# Patient Record
Sex: Male | Born: 1955 | Race: White | Hispanic: No | Smoking: Current every day smoker
Health system: Southern US, Community
[De-identification: ages and names within clinical notes are randomized; demographics above are authoritative.]

## PROBLEM LIST (undated history)

## (undated) DIAGNOSIS — E7800 Pure hypercholesterolemia, unspecified: Secondary | ICD-10-CM

## (undated) DIAGNOSIS — Z9989 Dependence on other enabling machines and devices: Secondary | ICD-10-CM

## (undated) DIAGNOSIS — I10 Essential (primary) hypertension: Secondary | ICD-10-CM

## (undated) DIAGNOSIS — G4733 Obstructive sleep apnea (adult) (pediatric): Secondary | ICD-10-CM

## (undated) HISTORY — DX: Pure hypercholesterolemia, unspecified: E78.00

## (undated) HISTORY — PX: BACK SURGERY: SHX140

## (undated) HISTORY — PX: LAPAROSCOPIC CHOLECYSTECTOMY: SUR755

## (undated) HISTORY — PX: TONSILLECTOMY: SUR1361

---

## 1998-12-17 ENCOUNTER — Ambulatory Visit: Admission: RE | Admit: 1998-12-17 | Discharge: 1998-12-17 | Payer: Self-pay | Admitting: Pulmonary Disease

## 2000-06-16 HISTORY — PX: POSTERIOR LUMBAR FUSION: SHX6036

## 2001-05-04 ENCOUNTER — Encounter: Admission: RE | Admit: 2001-05-04 | Discharge: 2001-05-04 | Payer: Self-pay | Admitting: Neurosurgery

## 2001-05-04 ENCOUNTER — Encounter: Payer: Self-pay | Admitting: Neurosurgery

## 2001-05-24 ENCOUNTER — Encounter: Payer: Self-pay | Admitting: Neurosurgery

## 2001-05-26 ENCOUNTER — Encounter: Payer: Self-pay | Admitting: Neurosurgery

## 2001-05-26 ENCOUNTER — Inpatient Hospital Stay (HOSPITAL_COMMUNITY): Admission: RE | Admit: 2001-05-26 | Discharge: 2001-05-28 | Payer: Self-pay | Admitting: Neurosurgery

## 2002-04-15 ENCOUNTER — Ambulatory Visit (HOSPITAL_COMMUNITY): Admission: RE | Admit: 2002-04-15 | Discharge: 2002-04-15 | Payer: Self-pay | Admitting: Internal Medicine

## 2002-07-13 ENCOUNTER — Encounter: Payer: Self-pay | Admitting: Internal Medicine

## 2002-07-13 ENCOUNTER — Ambulatory Visit (HOSPITAL_COMMUNITY): Admission: RE | Admit: 2002-07-13 | Discharge: 2002-07-13 | Payer: Self-pay | Admitting: Internal Medicine

## 2002-08-04 ENCOUNTER — Encounter: Payer: Self-pay | Admitting: Surgery

## 2002-08-10 ENCOUNTER — Observation Stay (HOSPITAL_COMMUNITY): Admission: RE | Admit: 2002-08-10 | Discharge: 2002-08-11 | Payer: Self-pay | Admitting: Surgery

## 2002-08-10 ENCOUNTER — Encounter: Payer: Self-pay | Admitting: Surgery

## 2002-10-12 ENCOUNTER — Emergency Department (HOSPITAL_COMMUNITY): Admission: EM | Admit: 2002-10-12 | Discharge: 2002-10-12 | Payer: Self-pay | Admitting: Emergency Medicine

## 2002-10-12 ENCOUNTER — Encounter: Payer: Self-pay | Admitting: *Deleted

## 2006-05-12 ENCOUNTER — Emergency Department (HOSPITAL_COMMUNITY): Admission: EM | Admit: 2006-05-12 | Discharge: 2006-05-12 | Payer: Self-pay | Admitting: Emergency Medicine

## 2013-12-16 ENCOUNTER — Inpatient Hospital Stay (HOSPITAL_COMMUNITY): Payer: No Typology Code available for payment source

## 2013-12-16 ENCOUNTER — Inpatient Hospital Stay (HOSPITAL_COMMUNITY)
Admission: EM | Admit: 2013-12-16 | Discharge: 2013-12-18 | DRG: 312 | Disposition: A | Discharge status: Home / Self Care | Payer: No Typology Code available for payment source | Attending: Family Medicine | Admitting: Family Medicine

## 2013-12-16 ENCOUNTER — Encounter (HOSPITAL_COMMUNITY): Payer: Self-pay | Admitting: Emergency Medicine

## 2013-12-16 ENCOUNTER — Emergency Department (HOSPITAL_COMMUNITY): Payer: No Typology Code available for payment source

## 2013-12-16 DIAGNOSIS — Z79899 Other long term (current) drug therapy: Secondary | ICD-10-CM

## 2013-12-16 DIAGNOSIS — R55 Syncope and collapse: Principal | ICD-10-CM | POA: Diagnosis present

## 2013-12-16 DIAGNOSIS — I10 Essential (primary) hypertension: Secondary | ICD-10-CM | POA: Diagnosis present

## 2013-12-16 DIAGNOSIS — R32 Unspecified urinary incontinence: Secondary | ICD-10-CM | POA: Diagnosis present

## 2013-12-16 DIAGNOSIS — Z9089 Acquired absence of other organs: Secondary | ICD-10-CM

## 2013-12-16 DIAGNOSIS — G4733 Obstructive sleep apnea (adult) (pediatric): Secondary | ICD-10-CM | POA: Diagnosis present

## 2013-12-16 DIAGNOSIS — I498 Other specified cardiac arrhythmias: Secondary | ICD-10-CM | POA: Diagnosis present

## 2013-12-16 DIAGNOSIS — L255 Unspecified contact dermatitis due to plants, except food: Secondary | ICD-10-CM | POA: Diagnosis present

## 2013-12-16 DIAGNOSIS — Z981 Arthrodesis status: Secondary | ICD-10-CM

## 2013-12-16 DIAGNOSIS — R001 Bradycardia, unspecified: Secondary | ICD-10-CM

## 2013-12-16 DIAGNOSIS — F17200 Nicotine dependence, unspecified, uncomplicated: Secondary | ICD-10-CM | POA: Diagnosis present

## 2013-12-16 HISTORY — DX: Obstructive sleep apnea (adult) (pediatric): G47.33

## 2013-12-16 HISTORY — DX: Dependence on other enabling machines and devices: Z99.89

## 2013-12-16 HISTORY — DX: Essential (primary) hypertension: I10

## 2013-12-16 LAB — RAPID URINE DRUG SCREEN, HOSP PERFORMED
AMPHETAMINES: NOT DETECTED
BENZODIAZEPINES: NOT DETECTED
Barbiturates: NOT DETECTED
COCAINE: NOT DETECTED
Opiates: NOT DETECTED
TETRAHYDROCANNABINOL: NOT DETECTED

## 2013-12-16 LAB — CBC
HCT: 44.3 % (ref 39.0–52.0)
HEMOGLOBIN: 15.3 g/dL (ref 13.0–17.0)
MCH: 31.9 pg (ref 26.0–34.0)
MCHC: 34.5 g/dL (ref 30.0–36.0)
MCV: 92.3 fL (ref 78.0–100.0)
Platelets: 208 10*3/uL (ref 150–400)
RBC: 4.8 MIL/uL (ref 4.22–5.81)
RDW: 12.4 % (ref 11.5–15.5)
WBC: 9.3 10*3/uL (ref 4.0–10.5)

## 2013-12-16 LAB — BASIC METABOLIC PANEL
Anion gap: 12 (ref 5–15)
BUN: 12 mg/dL (ref 6–23)
CO2: 25 mEq/L (ref 19–32)
Calcium: 9.4 mg/dL (ref 8.4–10.5)
Chloride: 102 mEq/L (ref 96–112)
Creatinine, Ser: 0.94 mg/dL (ref 0.50–1.35)
Glucose, Bld: 125 mg/dL — ABNORMAL HIGH (ref 70–99)
Potassium: 4.2 mEq/L (ref 3.7–5.3)
SODIUM: 139 meq/L (ref 137–147)

## 2013-12-16 LAB — I-STAT TROPONIN, ED: Troponin i, poc: 0 ng/mL (ref 0.00–0.08)

## 2013-12-16 LAB — URINALYSIS, ROUTINE W REFLEX MICROSCOPIC
BILIRUBIN URINE: NEGATIVE
Glucose, UA: NEGATIVE mg/dL
Hgb urine dipstick: NEGATIVE
Ketones, ur: NEGATIVE mg/dL
Leukocytes, UA: NEGATIVE
NITRITE: NEGATIVE
PROTEIN: NEGATIVE mg/dL
Specific Gravity, Urine: 1.012 (ref 1.005–1.030)
Urobilinogen, UA: 1 mg/dL (ref 0.0–1.0)
pH: 7.5 (ref 5.0–8.0)

## 2013-12-16 MED ORDER — ACETAMINOPHEN 650 MG RE SUPP: 650.0000 mg | Freq: Four times a day (QID) | RECTAL | Status: DC | PRN

## 2013-12-16 MED ORDER — PNEUMOCOCCAL VAC POLYVALENT 25 MCG/0.5ML IJ INJ
0.5000 mL | INJECTION | INTRAMUSCULAR | Status: AC
  Administered 2013-12-17: 0.5 mL via INTRAMUSCULAR
  Filled 2013-12-16: qty 0.5

## 2013-12-16 MED ORDER — SODIUM CHLORIDE 0.9 % IV SOLN
INTRAVENOUS | Status: AC
  Administered 2013-12-16: 15:00:00 via INTRAVENOUS

## 2013-12-16 MED ORDER — ACETAMINOPHEN 325 MG PO TABS: 650.0000 mg | ORAL_TABLET | Freq: Four times a day (QID) | ORAL | Status: DC | PRN

## 2013-12-16 MED ORDER — ATENOLOL 12.5 MG HALF TABLET
12.5000 mg | ORAL_TABLET | Freq: Two times a day (BID) | ORAL | Status: DC
  Administered 2013-12-16: 12.5 mg via ORAL
  Filled 2013-12-16 (×3): qty 1

## 2013-12-16 MED ORDER — HEPARIN SODIUM (PORCINE) 5000 UNIT/ML IJ SOLN
5000.0000 [IU] | Freq: Three times a day (TID) | INTRAMUSCULAR | Status: DC
  Administered 2013-12-16 – 2013-12-18 (×6): 5000 [IU] via SUBCUTANEOUS
  Filled 2013-12-16 (×8): qty 1

## 2013-12-16 MED ORDER — LISINOPRIL 40 MG PO TABS
40.0000 mg | ORAL_TABLET | Freq: Every day | ORAL | Status: DC
  Administered 2013-12-17 – 2013-12-18 (×2): 40 mg via ORAL
  Filled 2013-12-16 (×2): qty 1

## 2013-12-16 MED ORDER — SODIUM CHLORIDE 0.9 % IJ SOLN
3.0000 mL | Freq: Two times a day (BID) | INTRAMUSCULAR | Status: DC
  Administered 2013-12-16 – 2013-12-18 (×4): 3 mL via INTRAVENOUS

## 2013-12-16 MED ORDER — GADOBENATE DIMEGLUMINE 529 MG/ML IV SOLN
20.0000 mL | Freq: Once | INTRAVENOUS | Status: AC | PRN
  Administered 2013-12-16: 20 mL via INTRAVENOUS

## 2013-12-16 NOTE — ED Notes (Addendum)
Pt states he was sitting at desk and started to feel hot and pale, mildly dizzy. States that he was talking to someone and then "next thing you know I was on the floor." Per bystander, pt's eyes remained open during episode which lasted appx 2 minutes. Pt states he was diaphoretic after episode and was mildly dizzy. Pt noted to have urinary incontinence. Pt now denies any complaint. AO x4. Neuro intact. Pt states over the last week he has had intermittent episodes of dizziness and diaphoresis, but denies syncopal episodes. Pt now denies dizziness, blurred vision, double vision. Denies CP, N/V/D.

## 2013-12-16 NOTE — ED Provider Notes (Signed)
CSN: [id number]     Arrival date & time 12/16/13  1154 History   First MD Initiated Contact with Patient 12/16/13 1200     Chief Complaint  Patient presents with  . Loss of Consciousness     (Consider location/radiation/quality/duration/timing/severity/associated sxs/prior Treatment) HPI  Dr. Carylon Perchesoy Fagan - Stanley Montes, [place]  Patient presents to emergency department by EMS for evaluation after a witnessed syncopal episode. He reports that recently have been having multiple near syncopal episodes which have increased in frequency over the past 2 weeks. Today he reported needing to go to the bathroom to urinate but instead got very dizzy, lightheaded and felt cold feeling in his chest  and apparently syncopized. She reports that a customer called his head before it the ground. He reports when he came to feel woozy but now feels completely normal. He reports he has been told study of the heart murmur before but is unsure what kind it is. He had urinary incontinence during syncope. Currently his vital signs are 135/82, CBG is 106, oxygen is 98% on room air. His pulse is in the 80s and he is in normal sinus rhythm per EMS. She denies headache, fevers, chest pains, vomiting, diarrhea the, abdominal pain.  Past Medical History  Diagnosis Date  . Hypertension    Past Surgical History  Procedure Laterality Date  . Spinal fusion  2002  . Cholecystectomy     History reviewed. No pertinent family history. History  Substance Use Topics  . Smoking status: Current Every Day Smoker -- 0.25 packs/day for 35 years    Types: Cigarettes  . Smokeless tobacco: Not on file  . Alcohol Use: 6.0 oz/week    10 Shots of liquor per week    Review of Systems  Constitutional: Positive for diaphoresis.  Respiratory: Negative for shortness of breath.   Neurological: Positive for syncope and light-headedness.  All other systems reviewed and are negative.     Allergies  Review of patient's allergies indicates no  known allergies.  Home Medications   Prior to Admission medications   Medication Sig Start Date End Date Taking? Authorizing Provider  atenolol (TENORMIN) 25 MG tablet Take 12.5 mg by mouth 2 (two) times daily.   Yes Historical Provider, MD  citalopram (CELEXA) 10 MG tablet Take 10 mg by mouth daily.   Yes Historical Provider, MD  lisinopril (PRINIVIL,ZESTRIL) 40 MG tablet Take 40 mg by mouth daily.   Yes Historical Provider, MD  naproxen sodium (ANAPROX) 220 MG tablet Take 220 mg by mouth every 6 (six) hours as needed (pain).   Yes Historical Provider, MD   BP 134/83  Pulse 65  Temp(Src) 98.2 F (36.8 C) (Oral)  Resp 15  SpO2 99% Physical Exam  Nursing note and vitals reviewed. Constitutional: He appears well-developed and well-nourished. No distress.  HENT:  Head: Normocephalic and atraumatic.  Eyes: Pupils are equal, round, and reactive to light.  Neck: Normal range of motion. Neck supple.  Cardiovascular: Normal rate, regular rhythm, normal heart sounds and normal pulses.   Pulmonary/Chest: Effort normal and breath sounds normal.  Abdominal: Soft.  Neurological: He is alert. He has normal strength. No cranial nerve deficit or sensory deficit. He displays a negative Romberg sign. GCS eye subscore is 4. GCS verbal subscore is 5. GCS motor subscore is 6.  Skin: Skin is warm and dry.    ED Course  Procedures (including critical care time) Labs Review Labs Reviewed  BASIC METABOLIC PANEL - Abnormal; Notable for the following:  Glucose, Bld 125 (*)    All other components within normal limits  CBC  URINALYSIS, ROUTINE W REFLEX MICROSCOPIC  I-STAT TROPOININ, ED    Imaging Review Dg Chest 2 View  12/16/2013   CLINICAL DATA:  Dizziness  EXAM: CHEST  2 VIEW  COMPARISON:  None.  FINDINGS: Lungs are clear.  No pleural effusion or pneumothorax.  The heart is normal in size.  Visualized osseous structures are within normal limits.  IMPRESSION: No evidence of acute cardiopulmonary  disease.   Electronically Signed   By: Charline BillsSriyesh  Krishnan M.D.   On: 12/16/2013 14:14     EKG Interpretation None     Norina BuzzardSHAW, JEFF [id number] 16-Dec-2013 12:00:19 [facility] ROUTINE RECORD Sinus rhythm Probable left atrial enlargement Borderline T wave abnormalities 6325mm/s 910mm/mV 100Hz  8.0.1 CID: [id number] Referred by: Unconfirmed Vent. rate 59 BPM PR interval 166 ms QRS duration 105 ms QT/QTc 420/416 ms P-R-T axes 59 81 66 25-Apr-1956 (57 yr) Male Caucasian 72in 205lb Room:D34C Loc:11 Technician: [id number] Test ind:  MDM   Final diagnoses:  Syncope, unspecified syncope type    Patient with unknown cause of syncope and remote hx of murmur as a baby. His physical exam, lab work up and EKG in the ED are unremarkable. I have concern with her two weeks of worsening near syncope and today a 3 minute syncopal episode with urine incontinence. I feel that he needs to be worked up inpatient and have a cardiac echo done. Dr. Radford PaxBeaton agrees.  Inpatient, family Practice Medicine residents, telemetry, inpatient, Dr. Jennette KettleNeal, Welford RocheSara    Anush Wiedeman G Leith Szafranski, PA-C 12/16/13 1450

## 2013-12-16 NOTE — ED Notes (Signed)
Per EMS: Pt was sitting at desk when he had witnessed syncopal episode lasting 1-3 minutes.Pt was guided from his chair to floor by coworker. Pt states prior to episode he felt "flushed and dizzy." Pt reports similar episodes over the last year. Per coworkers, no seizure like activity was witnessed. Pt reports increase stress at home. Pt now AO x4, denies complaint. EKG NSR, 80's. No orthostatic changes. 135/82. CBG 106. 98% RA.

## 2013-12-16 NOTE — Progress Notes (Signed)
[place], and has found multiple ticks on him lately.  Pt's coworker reports the patient's body stiffened with his arms above his head before slumping down in his chair during the syncopal episode at work. Pt's eyes were open, and the pt reports he was having vivid dreams during the episode.

## 2013-12-16 NOTE — Progress Notes (Signed)
Pt admitted to the unit at 1530. Pt mental status is alert & O x 4. Pt oriented to room, staff, and call bell. Skin is intact. Full assessment charted in [facility]. Call bell within reach. Visitor guidelines reviewed w/ pt and/or family.

## 2013-12-16 NOTE — H&P (Signed)
[facility] Admission History and Physical Service Pager: [phone]  Patient name: Stanley Montes Medical record number: [id number] Date of birth: 12-Apr-1956 Age: 58 y.o. Gender: male  Primary Care Provider: Asencion Noble, MD Consultants: none Code Status: FULL  Chief Complaint: Loss of Consciousness  Assessment and Plan: Stanley Montes is a 58 y.o. male presenting with a witnessed syncopal episode. He had urinary incontinence during syncopal episode, and had lost consciousness for 1-3 minutes. He did not regain consciousness confused, no tongue pain or lesions, no jerking movements while unconscious. He denies headache, fevers, chest pains, vomiting, diarrhea, abdominal pain.  #Syncope, undifferentiated: Vasovagal vs. Hypoglycemia vs. Seizure vs. Cardiac vs. Pharmacological. Concern high for atypical seizure given urinary incontinence and possible aura-type symptoms. No history of seizures or provocative medications. Also possibly cardiac. No dehydration by history or exam. Orthostatics negative, CBG and labs wnl and no history of diabetes. ?Reaction to celexa  - Vitals: stable; HR70, BP136/80, RR18, T98.3, SpO2 97%  - CBC, BMP, and UA performed in ED: unremarkable  - Admitted under telemetry; saline lock  - CXR: unremarkable  - Echocardiogram ordered as well as MRI w/ contrast  - f/u AM labs, AM EKG, monitor telemetry  - UDS   - Monitor on telemetry   - Up with assistance  #Hypertension  - continue home medications: Lisinopril 90m QD, Atenolol 12.5 BID  #OSA  - well controlled via CPAP  - CPAP ordered  #Sinus Bradycardia on EKG: No signs of AV block or sick sinus. No murmurs on exam.  - monitor on telemetry   FEN/GI: Saline lock IV. Heart Healthy Diet Prophylaxis: Heparin 5,000units TID  [facility]  History of Present Illness: Stanley SCALAis a 58y.o. male presenting with a witnessed  syncopal episode. PMH is significant for HTN. He reports having multiple near syncopal episodes which have increased in frequency over the past 2 weeks. Today he reported needing to go to the bathroom to urinate but instead got very dizzy, diaphoretic, and felt cold feeling in his chest and apparently syncopized. Those who witnessed the event said he "went stiff and his eyes stayed open." He had urinary incontinence during syncopal episode, and had lost consciousness for 1-3 minutes. He did not regain consciousness confused, no tongue pain or lesions, no jerking movements while unconscious. He reports when he regained consciousness he felt woozy but now feels completely normal. Currently his vital signs are 135/82, CBG is 106, oxygen is 98% on room air. His pulse is in the 80s and he is in normal sinus rhythm.  He denies HA, palpitations, CP, SOB, recent change in appetite, Vomiting/Diarrhea, Hx seizures, Hx DM, prior Hx syncope, or slurred speech/facial droop prior to or after the event.   Review Of Systems: Per HPI Otherwise 12 point review of systems was performed and was unremarkable.  Patient Active Problem List   Diagnosis Date Noted  . Syncope 12/16/2013  . HTN (hypertension) 12/16/2013  . OSA (obstructive sleep apnea) 12/16/2013  . Sinus bradycardia on ECG 12/16/2013   Past Medical History: Past Medical History  Diagnosis Date  . Hypertension   . OSA on CPAP    Past Surgical History: Past Surgical History  Procedure Laterality Date  . Posterior lumbar fusion  2002  . Back surgery    . Tonsillectomy  ~ 1967  . Laparoscopic cholecystectomy  ~ 2003   Social History: History  Substance Use Topics  .  Smoking status: Current Every Day Smoker -- 0.25 packs/day for 37 years    Types: Cigarettes  . Smokeless tobacco: Never Used  . Alcohol Use: 6.0 oz/week    10 Shots of liquor per week   Additional social history: Married. Wife with Dx of Multiple Myeloma (currently in remission).  No recreational drug use.  Allergies and Medications: No Known Allergies No current facility-administered medications on file prior to encounter.   No current outpatient prescriptions on file prior to encounter.    Objective: BP 136/80  Pulse 70  Temp(Src) 98.3 F (36.8 C) (Oral)  Resp 18  SpO2 97% Exam: General -- oriented x3, pleasant and cooperative. HEENT -- Head is normocephalic. Eyes, pupils equal and round and react to light and accommodation. Extraocular motions are intact. Ears, nose and throat were benign. No tongue lacerations Neck -- supple; no bruits. Integument -- intact. No rash, erythema, or ecchymoses.  Chest -- good expansion. Lungs clear to auscultation. Non labored on ambient air Cardiac -- RRR. No murmurs noted. No JVD Abdomen -- soft, nontender. No masses palpable. Normal bowel sounds present. Genital, rectal and breast exam -- deferred. CNS -- cranial nerves II through XII grossly intact. 2+ reflexes bilaterally.Negative romberg, strength 5/5 in proximal and distal extremities x4 Musculoskeletal - no tenderness or effusions noted. ROM good. 5/5 bilateral strength. Dorsalis pedis pulses present and symmetrical.    Labs and Imaging: CBC BMET   Recent Labs Lab 12/16/13 1204  WBC 9.3  HGB 15.3  HCT 44.3  PLT 208    Recent Labs Lab 12/16/13 1204  NA 139  K 4.2  CL 102  CO2 25  BUN 12  CREATININE 0.94  GLUCOSE 125*  CALCIUM 9.4    Troponin neg x1    Component Value Date/Time   COLORURINE YELLOW 12/16/2013 1350   APPEARANCEUR CLEAR 12/16/2013 1350   LABSPEC 1.012 12/16/2013 1350   PHURINE 7.5 12/16/2013 1350   GLUCOSEU NEGATIVE 12/16/2013 1350   HGBUR NEGATIVE 12/16/2013 1350   BILIRUBINUR NEGATIVE 12/16/2013 1350   KETONESUR NEGATIVE 12/16/2013 1350   PROTEINUR NEGATIVE 12/16/2013 1350   UROBILINOGEN 1.0 12/16/2013 1350   NITRITE NEGATIVE 12/16/2013 1350   LEUKOCYTESUR NEGATIVE 12/16/2013 1350   CXR: No evidence of acute cardiopulmonary disease.  ECG:  Sinus bradycardia   Elberta Leatherwood, MD 12/16/2013, 5:06 PM PGY-1, [place] Intern pager: [phone], text pages welcome   I have read and agree with the amended note as above. My revisions are noted in red.  Stanley Barrack B. Bonner Puna, MD, PGY-2 12/16/2013 5:26 PM

## 2013-12-17 DIAGNOSIS — I517 Cardiomegaly: Secondary | ICD-10-CM

## 2013-12-17 DIAGNOSIS — R55 Syncope and collapse: Principal | ICD-10-CM

## 2013-12-17 DIAGNOSIS — I10 Essential (primary) hypertension: Secondary | ICD-10-CM

## 2013-12-17 DIAGNOSIS — I498 Other specified cardiac arrhythmias: Secondary | ICD-10-CM

## 2013-12-17 LAB — CBC
HCT: 43.6 % (ref 39.0–52.0)
HEMOGLOBIN: 14.8 g/dL (ref 13.0–17.0)
MCH: 31.3 pg (ref 26.0–34.0)
MCHC: 33.9 g/dL (ref 30.0–36.0)
MCV: 92.2 fL (ref 78.0–100.0)
Platelets: 230 10*3/uL (ref 150–400)
RBC: 4.73 MIL/uL (ref 4.22–5.81)
RDW: 12.4 % (ref 11.5–15.5)
WBC: 8.8 10*3/uL (ref 4.0–10.5)

## 2013-12-17 LAB — BASIC METABOLIC PANEL
ANION GAP: 12 (ref 5–15)
BUN: 13 mg/dL (ref 6–23)
CO2: 24 mEq/L (ref 19–32)
Calcium: 9.2 mg/dL (ref 8.4–10.5)
Chloride: 103 mEq/L (ref 96–112)
Creatinine, Ser: 0.97 mg/dL (ref 0.50–1.35)
GFR calc non Af Amer: >90 mL/min (ref >90)
Glucose, Bld: 105 mg/dL — ABNORMAL HIGH (ref 70–99)
Potassium: 4.1 mEq/L (ref 3.7–5.3)
Sodium: 139 mEq/L (ref 137–147)

## 2013-12-17 MED ORDER — CLOBETASOL PROPIONATE 0.05 % EX CREA
TOPICAL_CREAM | Freq: Two times a day (BID) | CUTANEOUS | Status: DC
  Administered 2013-12-17: 22:00:00 via TOPICAL
  Administered 2013-12-17: 1 via TOPICAL
  Administered 2013-12-18: 10:00:00 via TOPICAL
  Filled 2013-12-17: qty 15

## 2013-12-17 NOTE — Progress Notes (Signed)
Family Medicine Teaching Service Daily Progress Note Intern Pager: [phone]  Patient name: Stanley Montes Medical record number: [id number] Date of birth: [birth year] Age: 58 y.o. Gender: male  Primary Care Provider: Carylon PerchesFAGAN,ROY, MD Consultants: None Code Status: Full  Pt Overview and Major Events to Date:  7/3- Admitted with syncopal event, intermittent bradycardia   Assessment and Plan:  Stanley Montes is a 58 y.o. male presenting with a witnessed syncopal episode with stiffening of the arms and legs and urinary incontinence. His PMH is signofocant for HTN and HTN.   #Syncope, undifferentiated:  - From his Hx most likely seizure d/o but suspicious for symptomatic bradycardia, Also consider vasovagal. Vasovagal vs. Hypoglycemia vs. Seizure vs. Cardiac vs. Pharmacological.  - CBC, BMP, UA and UDS unremarkable  - MRI brain normal - CXR: unremarkable  - Echocardiogram this am pending - Continue telemetry, consider cards consult given symptoms and intermittent brady - EEG to help eval for Seizure d/o - Up with assistance   #Hypertension - controlled - continue home medications: Lisinopril 40mg  QD - dc atenolol with brady  #OSA  - well controlled via CPAP  - CPAP ordered   #Sinus Bradycardia on EKG: No signs of AV block or sick sinus. No murmurs on exam.  - With symptoms consider cards consult, dc atenolol - monitor on telemetry   FEN/GI: Saline lock IV. Heart Healthy Diet  Prophylaxis: Heparin 5,000units TID  Disposition: Continue tele for close monitoring and work up.   Subjective:  No more episodes, no chest pain, palpitations, or dyspnea.   Objective: Temp:  [98.1 F (36.7 C)-98.3 F (36.8 C)] 98.3 F (36.8 C) (07/04 0520) Pulse Rate:  [49-70] 55 (07/04 0602) Resp:  [12-21] 18 (07/04 0520) BP: (102-145)/(64-83) 102/64 mmHg (07/04 0520) SpO2:  [95 %-100 %] 96 % (07/04 0520) Weight:  [217 lb (98.431 kg)] 217 lb (98.431 kg) (07/03 1820) Physical Exam: Gen: NAD, alert,  cooperative with exam, getting a TTE during my exam HEENT: NCAT CV: brady but regular rhythm, limited by ECHO Resp: CTABL, no wheezes, non-labored Abd: SNTND, BS present, no guarding or organomegaly Ext: No edema, warm Neuro: Alert and oriented, No gross deficits   Laboratory:  Recent Labs Lab 12/16/13 1204 12/17/13 0400  WBC 9.3 8.8  HGB 15.3 14.8  HCT 44.3 43.6  PLT 208 230    Recent Labs Lab 12/16/13 1204 12/17/13 0400  NA 139 139  K 4.2 4.1  CL 102 103  CO2 25 24  BUN 12 13  CREATININE 0.94 0.97  CALCIUM 9.4 9.2  GLUCOSE 125* 105*   UDS negative   Imaging/Diagnostic Tests: MRI brain 7/3 IMPRESSION:  1. No acute or focal lesion to explain the patient's episode.  2. Normal MRI of the brain for age   Elenora GammaSamuel L Mayzee Reichenbach, MD 12/17/2013, 8:45 AM PGY-3, [place] [facility] FPT[facility] Intern pager: [phone], text pages welcome

## 2013-12-17 NOTE — ED Provider Notes (Signed)
Medical screening examination/treatment/procedure(s) were performed by non-physician practitioner and as supervising physician I was immediately available for consultation/collaboration.    Cherina Dhillon L Taunja Brickner, MD 12/17/13 0741 

## 2013-12-17 NOTE — Progress Notes (Signed)
CALL PAGER [phone] for any questions or notifications regarding this patient   FMTS Attending Daily Note: Sara Neal MD  Attending pager:[phone]  office [phone]  I  have seen and examined this patient, reviewed their chart. I have discussed this patient with the resident. I agree with the resident's findings, assessment and care plan. 

## 2013-12-17 NOTE — Progress Notes (Signed)
Echocardiogram 2D Echocardiogram has been performed.  Dorothey BasemanReel, Conal Shetley M 12/17/2013, 8:29 AM

## 2013-12-17 NOTE — H&P (Signed)
Call Pager [phone] for any questions or notifications regarding this patient  FMTS Attending Admission Note: Denny LevySara Alessandra Sawdey MD Attending pager:[phone] I  have seen and examined this patient, reviewed their chart. I have discussed this patient with the resident. I agree with the resident's findings, assessment and care plan. Further information obtained from his boss today (boss was present immediately after he fell to floor) was that he 'became rigid" for a brief time. This coupled with the incontinence of blsdder is concerning fro neurologic etiology such as seizure, although you can have some altered movements from syncope as well. Given that, we did MRI brain looking for new focus of seizure (tumor etc) which was negative. Have ordered EEG as well.  He is consistently bradycardic so we have stopped his beta blocker and are watching his telemetry. If EEG is negative, I think we will have exhausted that avenue, at least as far as acute inpt work up--he could get outpatient provoked EEG; we will likely then need to see if cardiology has any other input. He might need outpatient loop recorder or EP study. I have discussed these steps in his diagnostuic work up and he understands.

## 2013-12-17 NOTE — Progress Notes (Signed)
Pt places himself on and off home cpap. Advised to call if he needs help. Pt communicates understanding

## 2013-12-18 DIAGNOSIS — R001 Bradycardia, unspecified: Secondary | ICD-10-CM

## 2013-12-18 LAB — TSH: TSH: 1.01 u[IU]/mL (ref 0.350–4.500)

## 2013-12-18 MED ORDER — CLOBETASOL PROPIONATE 0.05 % EX CREA: TOPICAL_CREAM | Freq: Two times a day (BID) | CUTANEOUS | Status: DC

## 2013-12-18 MED ORDER — CITALOPRAM HYDROBROMIDE 10 MG PO TABS
10.0000 mg | ORAL_TABLET | Freq: Every day | ORAL | Status: DC
  Filled 2013-12-18: qty 1

## 2013-12-18 NOTE — Progress Notes (Signed)
Patient ambulated approximatly 500 feet with ease. Moderate speed HR 79-83, slow speed HR 66-71.

## 2013-12-18 NOTE — Discharge Instructions (Signed)
Syncope °Syncope is a medical term for fainting or passing out. This means you lose consciousness and drop to the ground. People are generally unconscious for less than 5 minutes. You may have some muscle twitches for up to 15 seconds before waking up and returning to normal. Syncope occurs more often in older adults, but it can happen to anyone. While most causes of syncope are not dangerous, syncope can be a sign of a serious medical problem. It is important to seek medical care.  °CAUSES  °Syncope is caused by a sudden drop in blood flow to the brain. The specific cause is often not determined. Factors that can bring on syncope include: °· Taking medicines that lower blood pressure. °· Sudden changes in posture, such as standing up quickly. °· Taking more medicine than prescribed. °· Standing in one place for too long. °· Seizure disorders. °· Dehydration and excessive exposure to heat. °· Low blood sugar (hypoglycemia). °· Straining to have a bowel movement. °· Heart disease, irregular heartbeat, or other circulatory problems. °· Fear, emotional distress, seeing blood, or severe pain. °SYMPTOMS  °Right before fainting, you may: °· Feel dizzy or light-headed. °· Feel nauseous. °· See all white or all black in your field of vision. °· Have cold, clammy skin. °DIAGNOSIS  °Your health care provider will ask about your symptoms, perform a physical exam, and perform an electrocardiogram (ECG) to record the electrical activity of your heart. Your health care provider may also perform other heart or blood tests to determine the cause of your syncope which may include: °· Transthoracic echocardiogram (TTE). During echocardiography, sound waves are used to evaluate how blood flows through your heart. °· Transesophageal echocardiogram (TEE). °· Cardiac monitoring. This allows your health care provider to monitor your heart rate and rhythm in real time. °· Holter monitor. This is a portable device that records your  heartbeat and can help diagnose heart arrhythmias. It allows your health care provider to track your heart activity for several days, if needed. °· Stress tests by exercise or by giving medicine that makes the heart beat faster. °TREATMENT  °In most cases, no treatment is needed. Depending on the cause of your syncope, your health care provider may recommend changing or stopping some of your medicines. °HOME CARE INSTRUCTIONS °· Have someone stay with you until you feel stable. °· Do not drive, use machinery, or play sports until your health care provider says it is okay. °· Keep all follow-up appointments as directed by your health care provider. °· Lie down right away if you start feeling like you might faint. Breathe deeply and steadily. Wait until all the symptoms have passed. °· Drink enough fluids to keep your urine clear or pale yellow. °· If you are taking blood pressure or heart medicine, get up slowly and take several minutes to sit and then stand. This can reduce dizziness. °SEEK IMMEDIATE MEDICAL CARE IF:  °· You have a severe headache. °· You have unusual pain in the chest, abdomen, or back. °· You are bleeding from your mouth or rectum, or you have black or tarry stool. °· You have an irregular or very fast heartbeat. °· You have pain with breathing. °· You have repeated fainting or seizure-like jerking during an episode. °· You faint when sitting or lying down. °· You have confusion. °· You have trouble walking. °· You have severe weakness. °· You have vision problems. °If you fainted, call your local emergency services (911 in [place] Do not drive   yourself to the hospital.  °MAKE SURE YOU: °· Understand these instructions. °· Will watch your condition. °· Will get help right away if you are not doing well or get worse. °Document Released: 06/02/2005 Document Revised: 06/07/2013 Document Reviewed: 08/01/2011 °[employer]® Patient Information ©2015 [employer]. This information is not intended to replace  advice given to you by your health care provider. Make sure you discuss any questions you have with your health care provider. ° °

## 2013-12-18 NOTE — Progress Notes (Signed)
Family Medicine Teaching Service Daily Progress Note Intern Pager: [phone]  Patient name: Stanley Montes Medical record number: [id number] Date of birth: October 10, 1955 Age: 58 y.o. Gender: male  Primary Care Provider: Carylon PerchesFAGAN,ROY, MD Consultants: None Code Status: Full  Pt Overview and Major Events to Date:  7/3- Admitted with syncopal event, intermittent bradycardia   Assessment and Plan:  Stanley Montes is a 58 y.o. male presenting with a witnessed syncopal episode with stiffening of the arms and legs and urinary incontinence. His PMH is signofocant for HTN and HTN.   #Syncope, undifferentiated:  - From his Hx most likely seizure d/o vs symptomatic bradycardia, Also consider vasovagal but inconsistent with Hx Vasovagal vs. Hypoglycemia vs. Seizure vs. Cardiac vs. Pharmacological.  - CBC, BMP, UA and UDS unremarkable  - MRI brain normal - CXR: unremarkable  - Echocardiogram this am pending - Continue telemetry, consider cards consult given symptoms and intermittent brady - EEG unable to obtain, will recommend OP - Cardiology referral for possible symptomatic bradycardia, would ask them to consider holter for DC and close follow up - Up with assistance   #Hypertension - controlled - continue home Lisinopril 40mg  QD - dc atenolol with brady  #OSA  - well controlled via CPAP  - CPAP ordered   #Sinus Bradycardia on EKG: No signs of AV block or sick sinus. No murmurs on exam.  - cards consulted - dc atenolol - monitor on telemetry   FEN/GI: Saline lock IV. Heart Healthy Diet  Prophylaxis: Heparin 5,000units TID  Disposition: Continue tele for close monitoring and work up.   Subjective:  Feeling well, denies additional episodes.   Objective: Temp:  [98 F (36.7 C)-98.6 F (37 C)] 98.2 F (36.8 C) (07/05 0637) Pulse Rate:  [54-59] 57 (07/05 0644) Resp:  [16] 16 (07/05 0637) BP: (116-121)/(69-79) 120/79 mmHg (07/05 0952) SpO2:  [96 %-97 %] 96 % (07/05 [id number]) Physical  Exam: Gen: NAD, alert, cooperative with exam HEENT: NCAT CV:RRR, no murmur Resp: CTABL, no wheezes, non-labored Abd: SNTND, BS present, no guarding or organomegaly Ext: No edema, warm Neuro: Alert and oriented, No gross deficits   Laboratory:  Recent Labs Lab 12/16/13 1204 12/17/13 0400  WBC 9.3 8.8  HGB 15.3 14.8  HCT 44.3 43.6  PLT 208 230    Recent Labs Lab 12/16/13 1204 12/17/13 0400  NA 139 139  K 4.2 4.1  CL 102 103  CO2 25 24  BUN 12 13  CREATININE 0.94 0.97  CALCIUM 9.4 9.2  GLUCOSE 125* 105*   UDS negative   Imaging/Diagnostic Tests: MRI brain 7/3 IMPRESSION:  1. No acute or focal lesion to explain the patient's episode.  2. Normal MRI of the brain for age  TTE 12/17/2013 EF 55% to 60% and grade 1 diastolic dysf   Elenora GammaSamuel L Dashanti Burr, MD 12/18/2013, 10:00 AM PGY-3, [facility] FPTS Intern pager: [phone], text pages welcome

## 2013-12-18 NOTE — Progress Notes (Signed)
CALL PAGER [phone] for any questions or notifications regarding this patient   FMTS Attending Daily Note: Stanley LevySara Tayari Yankee MD  Attending pager:[phone]  office [phone]  I  have seen and examined this patient, reviewed their chart. I have discussed this patient with the resident. I agree with the resident's findings, assessment and care plan.Appreciate cardiology input and will follow their recommendations with  Outpatient loop recorder, hold B blocker.F/u cards.

## 2013-12-18 NOTE — Consult Note (Signed)
Consulting cardiologist: Dr Stanley Montes   Clinical Summary Stanley Montes is a 58 y.o.male hx of HTN and OSA admitted with syncopal episode. Patient reportedly had urinary incontinence and + LOC for 1-3 minutes. Noted sinus bradycardia on admission with rates in 50s, EKG without evidence of block. Normal blood pressures.   Reports episode approx 2 weeks ago where he felt suddenly diaphoretic. Denies any chest pain or palpitations, occurred while sitting. Lasted approx 15 minutes then resolved. Reports similar feeling of diaphoresis this time, with associated lightheadedness and dizziness while sitting at work. He reportedly became very stiff and loss consciousness for approx 1-3 minutes, + urinary incontinence.   Echo with LVEF 55-60%, grade I diastolic dysfunction. MRI brain negative. K 4.2, Cr 0.94, Hgb 15.3. EKG sinus brady rate 50 to 59, no block.  No Known Allergies  Medications Scheduled Medications: . clobetasol cream   Topical BID  . heparin  5,000 Units Subcutaneous 3 times per day  . lisinopril  40 mg Oral Daily  . sodium chloride  3 mL Intravenous Q12H     Infusions:     PRN Medications:  acetaminophen, acetaminophen   Past Medical History  Diagnosis Date  . Hypertension   . OSA on CPAP     Past Surgical History  Procedure Laterality Date  . Posterior lumbar fusion  2002  . Back surgery    . Tonsillectomy  ~ 1967  . Laparoscopic cholecystectomy  ~ 2003    History reviewed. No pertinent family history.  Social History Stanley Montes reports that he has been smoking Cigarettes.  He has a 9.25 pack-year smoking history. He has never used smokeless tobacco. Stanley Montes reports that he drinks about 6 ounces of alcohol per week.  Review of Systems CONSTITUTIONAL: No weight loss, fever, chills, weakness or fatigue.  HEENT: Eyes: No visual loss, blurred vision, double vision or yellow sclerae. No hearing loss, sneezing, congestion, runny nose or sore throat.  SKIN:  No rash or itching.  CARDIOVASCULAR: No chest pain, chest pressure or chest discomfort. No palpitations or edema.  RESPIRATORY: No shortness of breath, cough or sputum.  GASTROINTESTINAL: No anorexia, nausea, vomiting or diarrhea. No abdominal pain or blood.  GENITOURINARY: no polyuria, no dysuria NEUROLOGICAL: per HPI MUSCULOSKELETAL: No muscle, back pain, joint pain or stiffness.  HEMATOLOGIC: No anemia, bleeding or bruising.  LYMPHATICS: No enlarged nodes. No history of splenectomy.  PSYCHIATRIC: No history of depression or anxiety.      Physical Examination Blood pressure 120/79, pulse 57, temperature 98.2 F (36.8 C), temperature source Oral, resp. rate 16, height 5\' 11"  (1.803 m), weight 217 lb (98.431 kg), SpO2 96.00%.  Intake/Output Summary (Last 24 hours) at 12/18/13 1021 Last data filed at 12/17/13 2041  Gross per 24 hour  Intake    123 ml  Output      0 ml  Net    123 ml    Cardiovascular: RRR, no m/r/g, no JVD  Respiratory: CTAB  GI: abdomen soft, NT, ND  MSK: no LE edema  Neuro: no focal deficits  Psych: appropriate affect   Lab Results  Basic Metabolic Panel:  Recent Labs Lab 12/16/13 1204 12/17/13 0400  NA 139 139  K 4.2 4.1  CL 102 103  CO2 25 24  GLUCOSE 125* 105*  BUN 12 13  CREATININE 0.94 0.97  CALCIUM 9.4 9.2    Liver Function Tests: No results found for this basename: AST, ALT, ALKPHOS, BILITOT, PROT, ALBUMIN,  in the  last 168 hours  CBC:  Recent Labs Lab 12/16/13 1204 12/17/13 0400  WBC 9.3 8.8  HGB 15.3 14.8  HCT 44.3 43.6  MCV 92.3 92.2  PLT 208 230    Cardiac Enzymes: No results found for this basename: CKTOTAL, CKMB, CKMBINDEX, TROPONINI,  in the last 168 hours  BNP: No components found with this basename: POCBNP,    ECG   Imaging 12/16/13 Echo Study Conclusions  - Left ventricle: Systolic function was normal. The estimated ejection fraction was in the range of 55% to 60%. Wall motion was normal; there  were no regional wall motion abnormalities. Doppler parameters are consistent with abnormal left ventricular relaxation (grade 1 diastolic dysfunction). - Left atrium: The atrium was mildly dilated.  Impressions:  - Normal LV function; mild LAE; grade 1 diastolic dysfunction.    Impression/Recommendations 1. Syncope - unclear etiology, fairly mixed episode of symptoms with muscle stiffening and urinary incontinence.  - orthostatics negative.  - EKG shows sinus brady in low 50s, tele with rates high 40s to 60s. No evidence of block. He was on low dose atenolol that was held on admission. Overall normal echo - unclear if mild bradycardia played role in episode. Will stay off atenolol, will have nursing staff ambulate patient with pulse monitoring to evaluate chronotropic competence. Arrange 21 day event monitor with cardiology follow [place] in [place] after that time. Counseled not to drive until follow up. If normal chronotropic response ok for discharge today.  - ordered TSH to be drawn, can be followed at follow up appointment.    Stanley RichJonathan [name], M.D., F.A.C.C.

## 2013-12-18 NOTE — Progress Notes (Signed)
Patient with appropriate chronotropic response to exercise. Ok for discharge from our standpoint. Spoke with our PA about arranging 21 day event monitor with follow up in cardiology after.   Dominga FerryJ Jennette Leask MD

## 2013-12-18 NOTE — Discharge Summary (Signed)
Family Medicine Teaching [facility] Discharge Summary  Patient name: Stanley MichaelsJeff S Montes Medical record number: [id number] Date of birth: 08-06-1955 Age: 58 y.o. Gender: male Date of Admission: 12/16/2013  Date of Discharge: 12/18/2013 Admitting Physician: Nestor RampSara L Neal, MD  Primary Care Provider: Carylon PerchesFAGAN,ROY, MD Consultants: Cardiology  Indication for Hospitalization: Syncope  Discharge Diagnoses/Problem List:  Syncope Bradycardia HTN OSA  Disposition: Home  Discharge Condition: Stable  Discharge Exam: Gen: NAD, alert, cooperative with exam  HEENT: NCAT  CV:RRR, no murmur  Resp: CTABL, no wheezes, non-labored  Abd: SNTND, BS present, no guarding or organomegaly  Ext: No edema, warm  Neuro: Alert and oriented, No gross deficits   Brief Hospital Course:  Stanley MichaelsJeff S Montes is a 58 y.o. male presenting with a witnessed syncopal episode with stiffening of the arms and legs and urinary incontinence. He was admitted and monitored on telemetry. A TTE and CT head, and MRI brain were unrevealing for etiology.  He had intermittent bradycardia to the 40s throughout his stay but no additional episodes. Atenolol was discontinued and his HTN remained well controlled on only lisinopril. Cardiology was called due to his bradycardia who recommended nursing staff ambulate patient with pulse monitoring to evaluate chronotropic competence. Arrange 21 day event monitor with cardiology follow [place] in [place] after that time. Neurologic etiology was considered, especially given his urinary incontinence and stiffening. An MRI brain was unrevealing but EEG couldn't be obtained due to lack of services on a holiday weekend.   He remaioned stable and without recurrence throughout admission and has easy access for close f/u with his PCP.   Celexa was considered as a cause and was held during admission but restarted on day of dc.   Clobetasol was given for poison ivy related rash on his foot.   Issues for Follow Up:  1.  Consider OP neurology referral vs obtaining EEG to eval for possibility for seizure d/o.  2. Monitor HTN closely given Atenolol was stopped here for bradycardia.  3. TTE with grade 1 diastolic dysf, likely clinically irrelevant but Good to keep in mind as he ages.   Significant Procedures: None  Significant Labs and Imaging:   Recent Labs Lab 12/16/13 1204 12/17/13 0400  WBC 9.3 8.8  HGB 15.3 14.8  HCT 44.3 43.6  PLT 208 230    Recent Labs Lab 12/16/13 1204 12/17/13 0400  NA 139 139  K 4.2 4.1  CL 102 103  CO2 25 24  GLUCOSE 125* 105*  BUN 12 13  CREATININE 0.94 0.97  CALCIUM 9.4 9.2    MRI brain 7/3  IMPRESSION:  1. No acute or focal lesion to explain the patient's episode.  2. Normal MRI of the brain for age   TTE 12/17/2013  EF 55% to 60% and grade 1 diastolic dysf   Results/Tests Pending at Time of Discharge: TSH (wnl)  Discharge Medications:    Medication List    STOP taking these medications       atenolol 25 MG tablet  Commonly known as:  TENORMIN      TAKE these medications       citalopram 10 MG tablet  Commonly known as:  CELEXA  Take 10 mg by mouth daily.     clobetasol cream 0.05 %  Commonly known as:  TEMOVATE  Apply topically 2 (two) times daily. Use for no more than 10 days in a row. Avoid face, underarms, and groin.     lisinopril 40 MG tablet  Commonly known  as:  PRINIVIL,ZESTRIL  Take 40 mg by mouth daily.     naproxen sodium 220 MG tablet  Commonly known as:  ANAPROX  Take 220 mg by mouth every 6 (six) hours as needed (pain).        Discharge Instructions: Please refer to Patient Instructions section of EMR for full details.  Patient was counseled important signs and symptoms that should prompt return to medical care, changes in medications, dietary instructions, activity restrictions, and follow up appointments.   Follow-Up Appointments: Follow-up Information   Call Carylon PerchesFAGAN,ROY, MD. (call for appt tomorrow)    Specialty:   Internal Medicine   Contact information:   [address] [place] [address] [phone]       Follow up with [facility] Call in 1 day. (for event monitor set up and follow up)    Specialty:  Cardiology   Contact information:   [address], [address] [place] [address] [phone]      Kathee DeltonIan D McKeag, MD 12/18/2013, 5:18 PM PGY-1, [facility]

## 2013-12-18 NOTE — Progress Notes (Signed)
Nsg Discharge Note  Admit Date:  12/16/2013 Discharge date: 12/18/2013   Stanley MichaelsJeff S Montes to be D/C'd Home per MD order.  AVS completed.  Copy for chart, and copy for patient signed, and dated. Patient/caregiver able to verbalize understanding.  Discharge Medication:   Medication List    STOP taking these medications       atenolol 25 MG tablet  Commonly known as:  TENORMIN      TAKE these medications       citalopram 10 MG tablet  Commonly known as:  CELEXA  Take 10 mg by mouth daily.     clobetasol cream 0.05 %  Commonly known as:  TEMOVATE  Apply topically 2 (two) times daily. Use for no more than 10 days in a row. Avoid face, underarms, and groin.     lisinopril 40 MG tablet  Commonly known as:  PRINIVIL,ZESTRIL  Take 40 mg by mouth daily.     naproxen sodium 220 MG tablet  Commonly known as:  ANAPROX  Take 220 mg by mouth every 6 (six) hours as needed (pain).        Discharge Assessment: Filed Vitals:   12/18/13 1334  BP: 122/80  Pulse: 71  Temp: 98.1 F (36.7 C)  Resp: 20   Skin clean, dry and intact without evidence of skin break down, no evidence of skin tears noted. IV catheter discontinued intact. Site without signs and symptoms of complications - no redness or edema noted at insertion site, patient denies c/o pain - only slight tenderness at site.  Dressing with slight pressure applied.  D/c Instructions-Education: Discharge instructions given to patient/family with verbalized understanding. D/c education completed with patient/family including follow up instructions, medication list, d/c activities limitations if indicated, with other d/c instructions as indicated by MD - patient able to verbalize understanding, all questions fully answered. Patient instructed to return to ED, call 911, or call MD for any changes in condition.  Patient escorted via WC, and D/C home via private auto.  Mari Battaglia, Brooke BonitoKrystal A, RN 12/18/2013 6:22 PM

## 2013-12-20 ENCOUNTER — Other Ambulatory Visit (HOSPITAL_COMMUNITY): Payer: Self-pay

## 2013-12-20 ENCOUNTER — Other Ambulatory Visit: Payer: Self-pay | Admitting: *Deleted

## 2013-12-20 DIAGNOSIS — R55 Syncope and collapse: Secondary | ICD-10-CM

## 2013-12-21 ENCOUNTER — Ambulatory Visit (HOSPITAL_COMMUNITY)
Admission: RE | Admit: 2013-12-21 | Discharge: 2013-12-21 | Disposition: A | Discharge status: Home / Self Care | Payer: No Typology Code available for payment source | Source: Ambulatory Visit | Attending: Internal Medicine | Admitting: Internal Medicine

## 2013-12-21 DIAGNOSIS — R569 Unspecified convulsions: Secondary | ICD-10-CM | POA: Insufficient documentation

## 2013-12-21 DIAGNOSIS — R55 Syncope and collapse: Secondary | ICD-10-CM

## 2013-12-21 NOTE — Discharge Summary (Signed)
.  Family Medicine Teaching Service  Discharge Note : Attending Monserath Neff MD Pager [phone] Office [phone] I have seen and examined this patient, reviewed their chart and discussed discharge planning with the resident at the time of discharge. I agree with the discharge plan as above.  

## 2013-12-21 NOTE — Progress Notes (Signed)
EEG Completed; Results Pending  

## 2013-12-22 NOTE — Procedures (Signed)
  [facility] Jakori Burkett A. Gerilyn Pilgrimoonquah, MD     [email or web address]           HISTORY: This is a 58 year old man who presents with spells suspicious for seizure activity.  MEDICATIONS: Scheduled Meds: Continuous Infusions: PRN Meds:.    Prior to Admission medications   Medication Sig Start Date End Date Taking? Authorizing Provider  citalopram (CELEXA) 10 MG tablet Take 10 mg by mouth daily.    Historical Provider, MD  clobetasol cream (TEMOVATE) 0.05 % Apply topically 2 (two) times daily. Use for no more than 10 days in a row. Avoid face, underarms, and groin. 12/18/13   Elenora GammaSamuel L Bradshaw, MD  lisinopril (PRINIVIL,ZESTRIL) 40 MG tablet Take 40 mg by mouth daily.    Historical Provider, MD  naproxen sodium (ANAPROX) 220 MG tablet Take 220 mg by mouth every 6 (six) hours as needed (pain).    Historical Provider, MD      ANALYSIS: A 16 channel recording using standard 10 20 measurements is conducted for 22 minutes. There is a well-formed posterior dominant rhythm of 12 Hz which attenuates with eye opening. There is beta activity observed in the frontal areas. Awake and drowsy activities are recorded. Photic stimulation is carried out without abnormal changes in the background activity. There are no focal or lateralized slowing. There are no epileptiform activity was observed.   IMPRESSION: 1. This a normal recording awake and drowsy states.      Ambreen Tufte A. Gerilyn Pilgrimoonquah, M.D.  Diplomate, Biomedical engineerAmerican Board of Psychiatry and Neurology ( Neurology).

## 2014-01-02 ENCOUNTER — Telehealth: Payer: Self-pay | Admitting: Cardiology

## 2014-01-02 NOTE — Telephone Encounter (Signed)
Discussed issue with patient.  Advised patient to go ahead with new monitor as the previous readings did no have a lot of good data.  The last few days had a lot of lead loss.  Patient stated that he was having a very hard time working out transportation getting to work as his wife does not work.  Patient questioned if he could wear x 1 more week & call back to see if we have enough data.  Patient understands the importance of this monitor, but driving is critical issue for him.  Informed him that would be okay.  We can confirm with MD at that time.

## 2014-01-02 NOTE — Telephone Encounter (Signed)
We need a full 21 days of date before we can make decision about driving due to the risks involved not only to him but to others. Please have him follow up right around time monitor is completed   Dominga FerryJ Azhar Knope MD

## 2014-01-04 NOTE — Telephone Encounter (Signed)
Patient notified.  Post hospital scheduled for Tuesday, 01/16/2014 with Dr. Wyline MoodBranch.

## 2014-01-17 ENCOUNTER — Ambulatory Visit (INDEPENDENT_AMBULATORY_CARE_PROVIDER_SITE_OTHER): Payer: Managed Care, Other (non HMO) | Admitting: Cardiology

## 2014-01-17 ENCOUNTER — Encounter: Payer: Self-pay | Admitting: Cardiology

## 2014-01-17 VITALS — BP 133/85 | HR 101 | Ht 72.0 in | Wt 224.0 lb

## 2014-01-17 DIAGNOSIS — R55 Syncope and collapse: Secondary | ICD-10-CM

## 2014-01-17 NOTE — Progress Notes (Signed)
Clinical Summary Stanley Montes is a 58 y.o.male seen today for follow up of the following medical problems.  1. Syncope - admission 12/2013 with syncope. Reportedly had urinary incontinence, became very stiff, + LOC - sinus brady noted during admission with rates in 50s, no evidence of block, and normal blood pressure - Echo with LVEF 55-60%, grade I diastolic dysfunction. MRI brain negative.EKG sinus brady rate 50 to 59, no block. TSH 1.010.  - orthostatics negative - had been on atenolol at time of event, held during admission and not restarted at discharge  - ambulated during admission with heart rate monitoring, HR increased 57 up to 80s at moderate speed. - 21 day event monitor ordered at discharge, no significant arrhythmias  - patient denies any symptoms since discharge. Checks blood pressure and pulse regularly. States his baseline pulse since being of atenolol has increased from low 60s to low 90s.    2. HTN - checks daily, typically around 140s/80s. Reports he does not consistently check after taking meds or after resting for 5 minutes.   Past Medical History  Diagnosis Date  . Hypertension   . OSA on CPAP      No Known Allergies   Current Outpatient Prescriptions  Medication Sig Dispense Refill  . citalopram (CELEXA) 10 MG tablet Take 10 mg by mouth daily.      . clobetasol cream (TEMOVATE) 0.05 % Apply topically 2 (two) times daily. Use for no more than 10 days in a row. Avoid face, underarms, and groin.  15 g  0  . lisinopril (PRINIVIL,ZESTRIL) 40 MG tablet Take 40 mg by mouth daily.      . naproxen sodium (ANAPROX) 220 MG tablet Take 220 mg by mouth every 6 (six) hours as needed (pain).       No current facility-administered medications for this visit.     Past Surgical History  Procedure Laterality Date  . Posterior lumbar fusion  2002  . Back surgery    . Tonsillectomy  ~ 1967  . Laparoscopic cholecystectomy  ~ 2003     No Known Allergies    No  family history on file.   Social History Stanley Montes reports that he has been smoking Cigarettes.  He has a 9.25 pack-year smoking history. He has never used smokeless tobacco. Stanley Montes reports that he drinks about 6 ounces of alcohol per week.   Review of Systems CONSTITUTIONAL: No weight loss, fever, chills, weakness or fatigue.  HEENT: Eyes: No visual loss, blurred vision, double vision or yellow sclerae.No hearing loss, sneezing, congestion, runny nose or sore throat.  SKIN: No rash or itching.  CARDIOVASCULAR: per HPI RESPIRATORY: No shortness of breath, cough or sputum.  GASTROINTESTINAL: No anorexia, nausea, vomiting or diarrhea. No abdominal pain or blood.  GENITOURINARY: No burning on urination, no polyuria NEUROLOGICAL: No headache, dizziness, syncope, paralysis, ataxia, numbness or tingling in the extremities. No change in bowel or bladder control.  MUSCULOSKELETAL: No muscle, back pain, joint pain or stiffness.  LYMPHATICS: No enlarged nodes. No history of splenectomy.  PSYCHIATRIC: No history of depression or anxiety.  ENDOCRINOLOGIC: No reports of sweating, cold or heat intolerance. No polyuria or polydipsia.  Marland Kitchen.   Physical Examination p 101 bp 133/85 Wt 224 lbs BMI 30 Gen: resting comfortably, no acute distress HEENT: no scleral icterus, pupils equal round and reactive, no palptable cervical adenopathy,  CV: RRR, no m/r/g, no JVD, no carotid bruits Resp: Clear to auscultation bilaterally GI: abdomen is  soft, non-tender, non-distended, normal bowel sounds, no hepatosplenomegaly MSK: extremities are warm, no edema.  Skin: warm, no rash Neuro:  no focal deficits Psych: appropriate affect    Assessment and Plan  1. Syncope - isolated event 12/2013, no clear cardiac cause - 21 day event monitor post-discharge without significant arrhythmias. No recurrent symptoms - bradycardia resolved of atenolol - no further cardiac workup at this time.   2. HTN - educated on  technique to measure at home. He is to keep bp log and take to his primary next visit. - bp today in clinic borderline, continue lisinopril for now. Additional agent pending home bp log results    F/u 1 year.   Antoine Poche, M.D., F.A.C.C.

## 2014-01-17 NOTE — Patient Instructions (Addendum)
Continue all current medications. Your physician has requested that you regularly monitor and record your blood pressure readings at home. Please take readings approximately 2 hours after medication. Return to primary doctor for review.  Your physician wants you to follow up in:  1 year.  You will receive a reminder letter in the mail one-two months in advance.  If you don't receive a letter, please call our office to schedule the follow up appointment.

## 2014-03-16 ENCOUNTER — Encounter: Payer: Self-pay | Admitting: *Deleted

## 2014-03-17 ENCOUNTER — Ambulatory Visit (INDEPENDENT_AMBULATORY_CARE_PROVIDER_SITE_OTHER): Payer: Managed Care, Other (non HMO) | Admitting: Pulmonary Disease

## 2014-03-17 ENCOUNTER — Encounter: Payer: Self-pay | Admitting: Pulmonary Disease

## 2014-03-17 VITALS — BP 128/76 | HR 92 | Temp 98.0°F | Ht 72.0 in | Wt 232.4 lb

## 2014-03-17 DIAGNOSIS — G4733 Obstructive sleep apnea (adult) (pediatric): Secondary | ICD-10-CM

## 2014-03-17 NOTE — Patient Instructions (Signed)
Will give you a prescription for a new cpap device.  Let us know if this is too expensive Work on weight loss followup with me again in one year.

## 2014-03-17 NOTE — Assessment & Plan Note (Signed)
The patient has a history of obstructive sleep apnea dating back to 1999, and he has done well with CPAP over the years. He now is in need of a new CPAP device, and we will order this for him. I would like for him to get a CPAP machine that has heated humidity, and also has an automatic setting for treatment. I have written him a prescription for a new device, and have also given him a list of more economical alternatives if this is too expensive. He tells me that he has a high deductible plan for his insurance. Additionally, I have encouraged him to work aggressively on weight loss, and to followup with me on a yearly basis.

## 2014-03-17 NOTE — Progress Notes (Signed)
Subjective:    Patient ID: Stanley Montes, male    DOB: 1955/11/10, 58 y.o.   MRN: [id number]  HPI The patient is a 58 year old male who I've been asked to see for management of obstructive sleep apnea. He was diagnosed back in 1999, and his study is currently not available for review. I apparently saw him at that time, and he was started on CPAP. The patient states that he has been on the same machine since that time, and has done very very well. He has kept up with his mask changes, and as well as his supplies. Currently, he is using a nasal mask, but does not have a heated humidifier. He feels that he sleeps well during the night, and feels rested in the mornings upon arising. He feels that his alertness is adequate during the day, but does have some sleep pressure in the evenings trying to watch television. He also has rare sleep pressure with driving. The patient's weight is up about 15 pounds since last visit, and his Epworth score today is 5   Sleep Questionnaire What time do you typically go to bed?( Between what hours) 10-11PM 10-11PM at 1531 on 03/17/14 by Tommie Sams, CMA How long does it take you to fall asleep? 10 min 10 min at 1531 on 03/17/14 by Tommie Sams, CMA How many times during the night do you wake up? 1 1 at 1531 on 03/17/14 by Tommie Sams, CMA What time do you get out of bed to start your day? 0530 0530 at 1531 on 03/17/14 by Tommie Sams, CMA Do you drive or operate heavy machinery in your occupation? No No at 1531 on 03/17/14 by Tommie Sams, CMA How much has your weight changed (up or down) over the past two years? (In pounds) 0 oz (0 kg) 0 oz (0 kg) at 1531 on 03/17/14 by Tommie Sams, CMA Have you ever had a sleep study before? Yes Yes at 1531 on 03/17/14 by Tommie Sams, CMA If yes, location of study? [facility]  at 1531 on 03/17/14 by Tommie Sams, CMA If yes, date of study? 1999 1999 at 1531 on 03/17/14 by Tommie Sams, CMA Do you currently  use CPAP? Yes Yes at 1531 on 03/17/14 by Tommie Sams, CMA If so, what pressure? not sure not sure at 1531 on 03/17/14 by Tommie Sams, CMA Do you wear oxygen at any time? No No at 1531 on 03/17/14 by Tommie Sams, CMA   Review of Systems  Constitutional: Negative for fever and unexpected weight change.  HENT: Negative for congestion, dental problem, ear pain, nosebleeds, postnasal drip, rhinorrhea, sinus pressure, sneezing, sore throat and trouble swallowing.   Eyes: Negative for redness and itching.  Respiratory: Negative for cough, chest tightness, shortness of breath and wheezing.   Cardiovascular: Negative for palpitations and leg swelling.  Gastrointestinal: Negative for nausea and vomiting.  Genitourinary: Negative for dysuria.  Musculoskeletal: Negative for joint swelling.  Skin: Negative for rash.  Neurological: Negative for headaches.  Hematological: Does not bruise/bleed easily.  Psychiatric/Behavioral: Negative for dysphoric mood. The patient is not nervous/anxious.        Objective:   Physical Exam Constitutional:  Overweight male, no acute distress  HENT:  Nares patent without discharge, mild septal deviation to the left  Oropharynx without exudate, palate and uvula are elongated.   Eyes:  Perrla, eomi, no scleral icterus  Neck:  No JVD, no  TMG  Cardiovascular:  Normal rate, regular rhythm, no rubs or gallops.  No murmurs        Intact distal pulses  Pulmonary :  Normal breath sounds, no stridor or respiratory distress   No rales, rhonchi, or wheezing  Abdominal:  Soft, nondistended, bowel sounds present.  No tenderness noted.   Musculoskeletal:  No lower extremity edema noted.  Lymph Nodes:  No cervical lymphadenopathy noted  Skin:  No cyanosis noted  Neurologic:  Alert, appropriate, moves all 4 extremities without obvious deficit.         Assessment & Plan:

## 2015-02-26 IMAGING — CR DG CHEST 2V
2 series · 2 of 2 positions shown · non-contrast
Comparison: None.

CLINICAL DATA: Dizziness

EXAM:
CHEST  2 VIEW

[w chest pa]
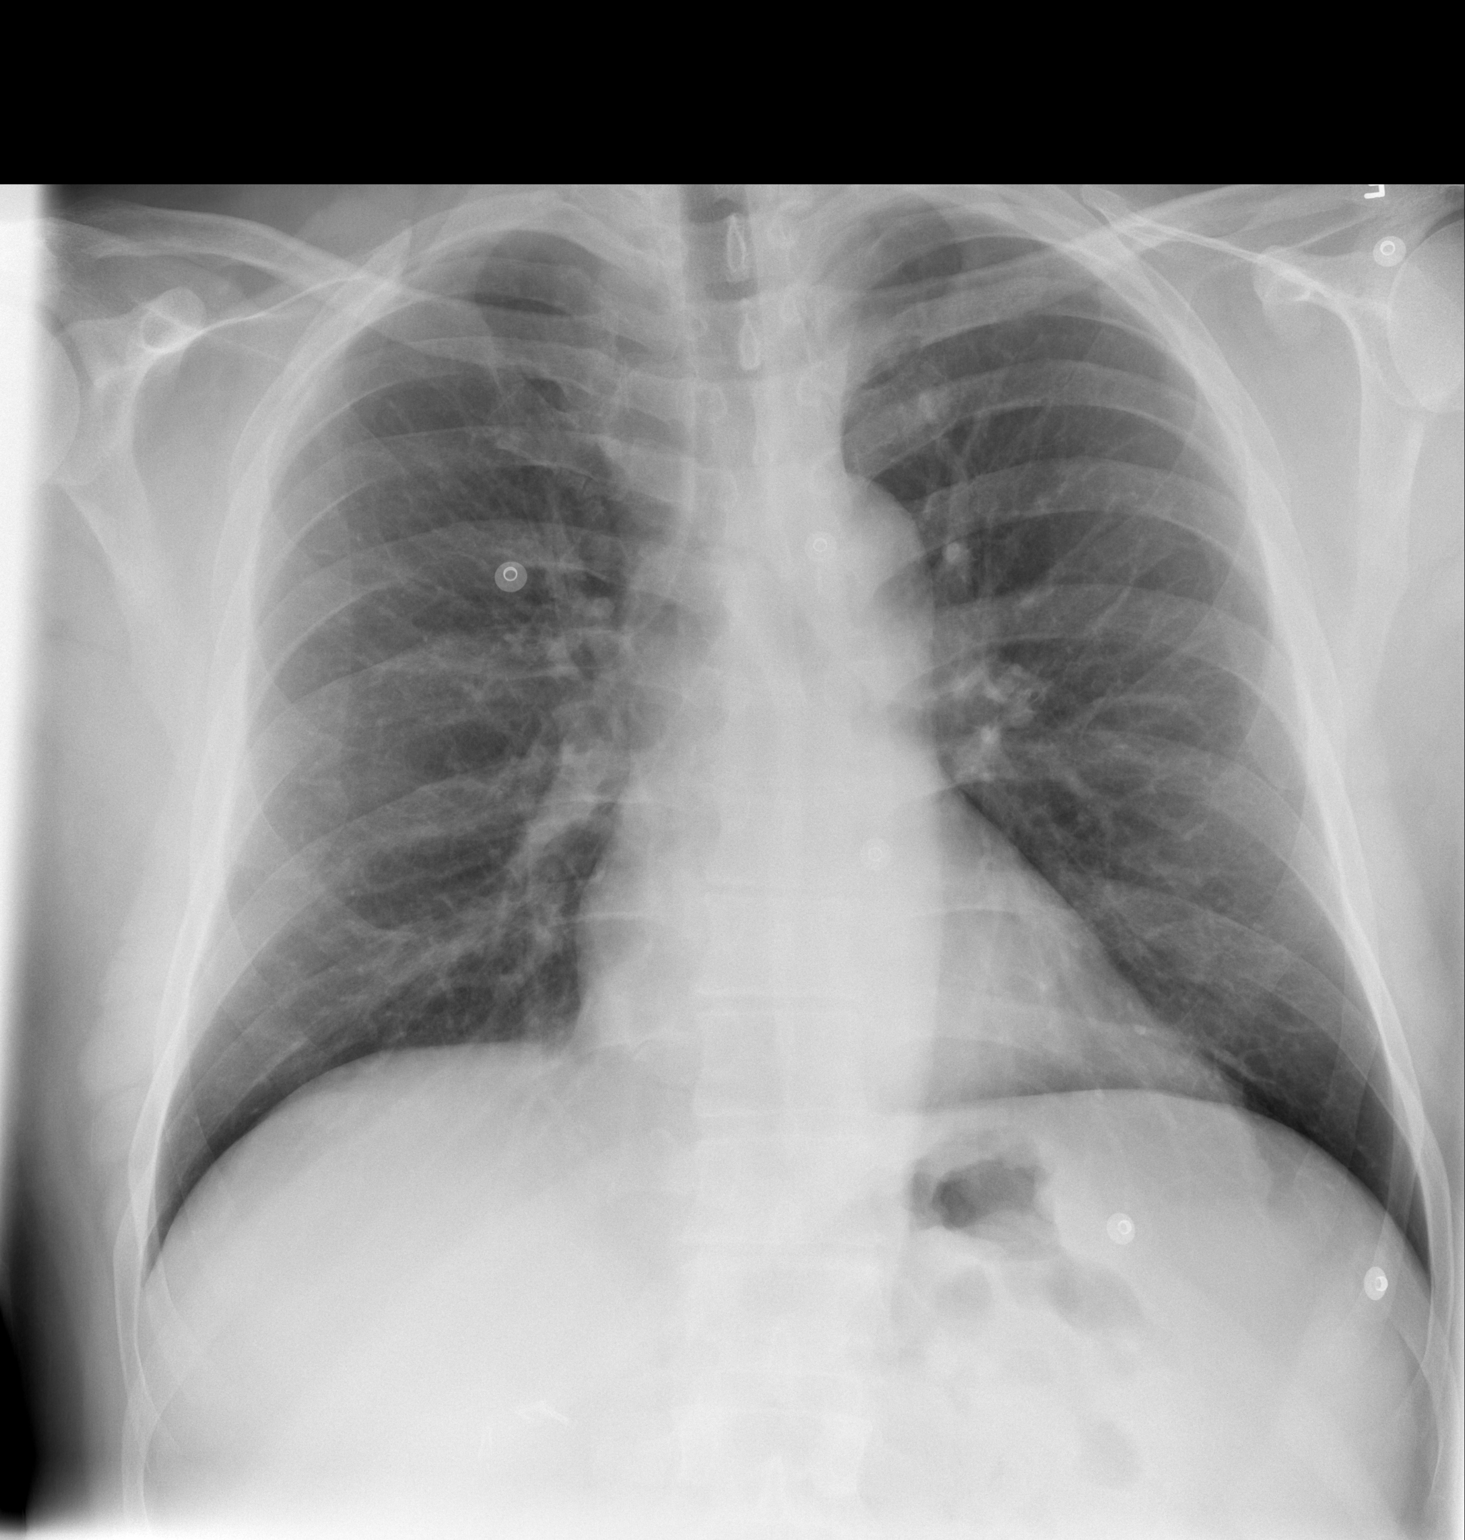

[w chest lat]
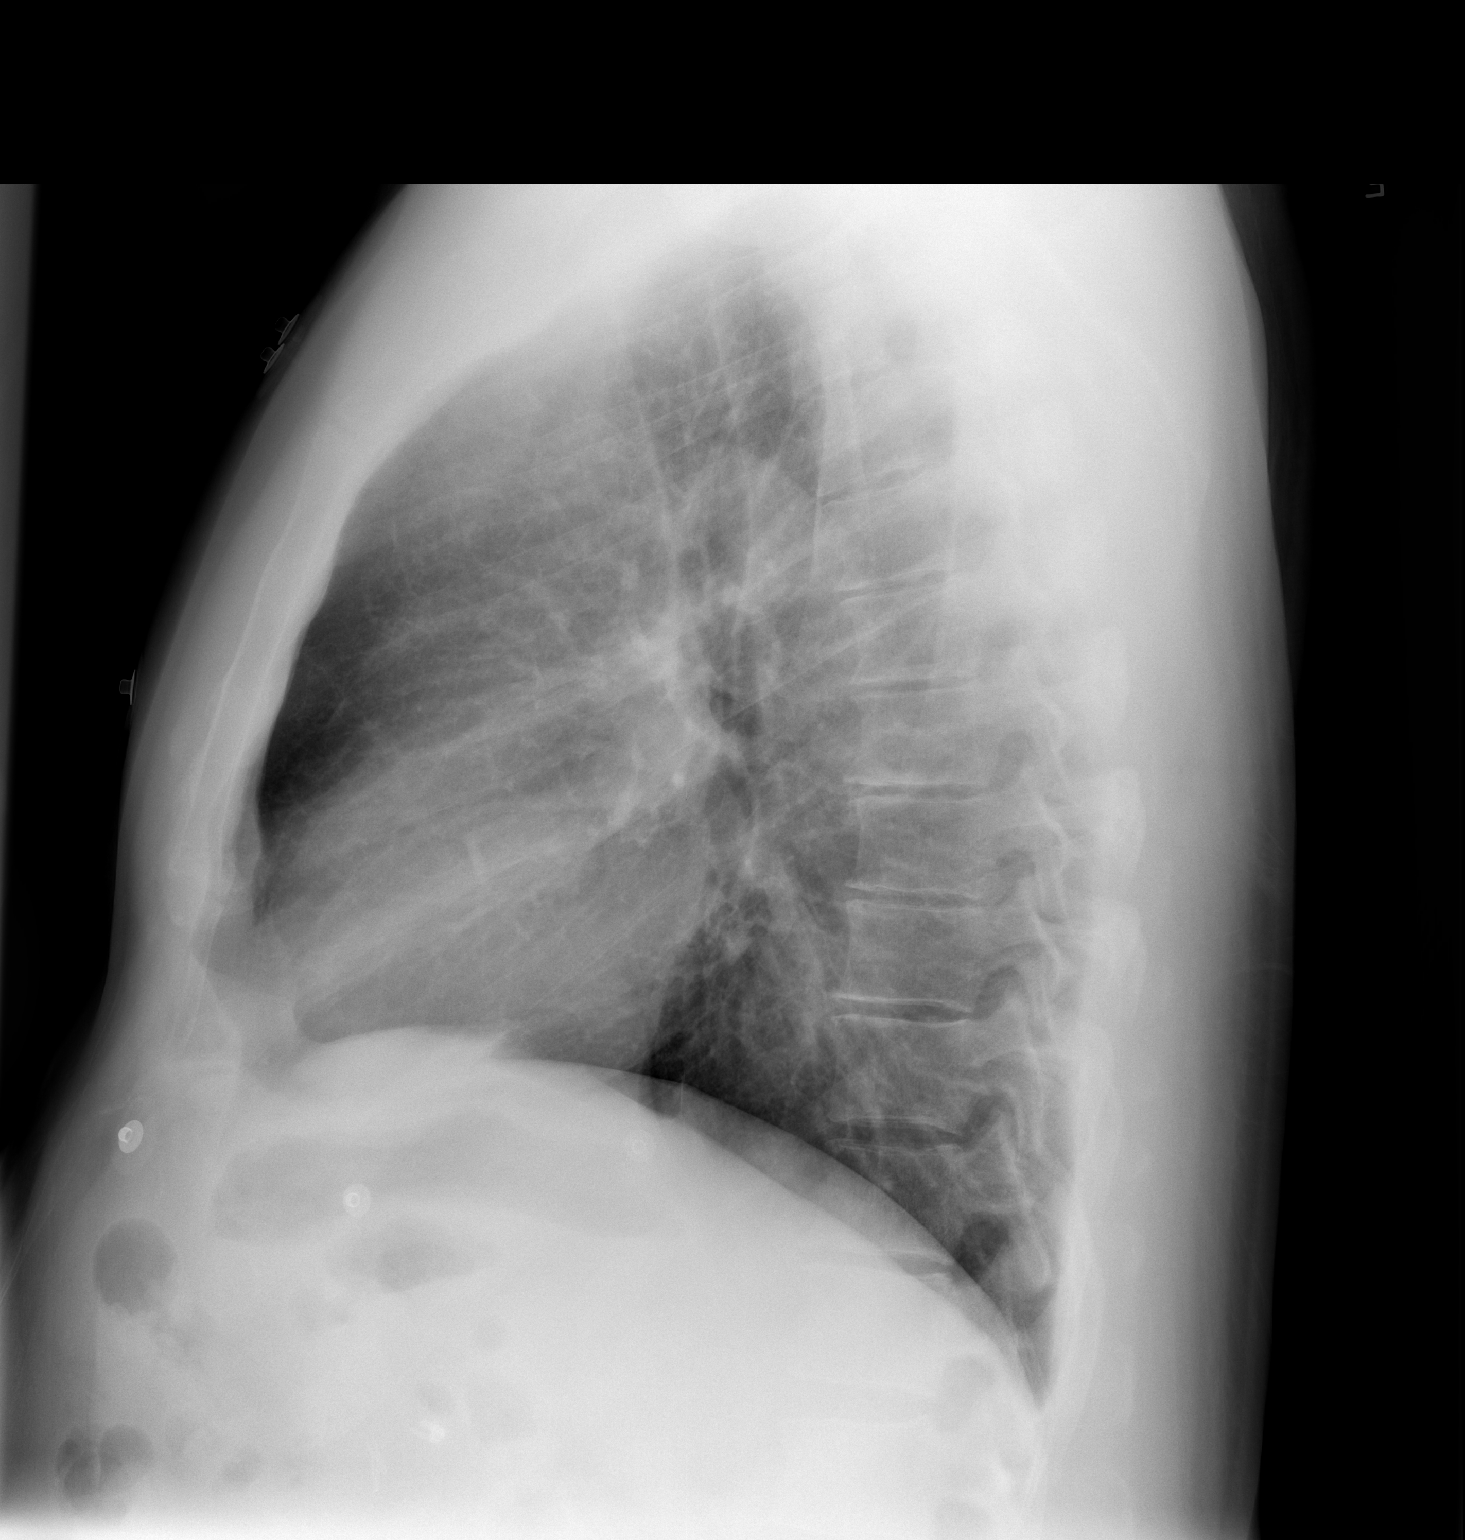

[2 of 2 positions shown; findings below may reference images not displayed]

FINDINGS: Lungs are clear.  No pleural effusion or pneumothorax.

The heart is normal in size.

Visualized osseous structures are within normal limits.
IMPRESSION: No evidence of acute cardiopulmonary disease.

## 2018-01-28 DIAGNOSIS — R42 Dizziness and giddiness: Secondary | ICD-10-CM | POA: Diagnosis not present

## 2018-01-28 DIAGNOSIS — I10 Essential (primary) hypertension: Secondary | ICD-10-CM | POA: Diagnosis not present

## 2018-04-01 DIAGNOSIS — Z23 Encounter for immunization: Secondary | ICD-10-CM | POA: Diagnosis not present

## 2018-06-23 DIAGNOSIS — Z79899 Other long term (current) drug therapy: Secondary | ICD-10-CM | POA: Diagnosis not present

## 2018-06-23 DIAGNOSIS — I10 Essential (primary) hypertension: Secondary | ICD-10-CM | POA: Diagnosis not present

## 2018-06-23 DIAGNOSIS — E785 Hyperlipidemia, unspecified: Secondary | ICD-10-CM | POA: Diagnosis not present

## 2018-06-23 DIAGNOSIS — R7303 Prediabetes: Secondary | ICD-10-CM | POA: Diagnosis not present

## 2018-06-30 DIAGNOSIS — I10 Essential (primary) hypertension: Secondary | ICD-10-CM | POA: Diagnosis not present

## 2018-06-30 DIAGNOSIS — Z6829 Body mass index (BMI) 29.0-29.9, adult: Secondary | ICD-10-CM | POA: Diagnosis not present

## 2018-06-30 DIAGNOSIS — R7303 Prediabetes: Secondary | ICD-10-CM | POA: Diagnosis not present

## 2019-04-15 ENCOUNTER — Other Ambulatory Visit: Payer: Self-pay

## 2019-04-15 DIAGNOSIS — Z20822 Contact with and (suspected) exposure to covid-19: Secondary | ICD-10-CM

## 2019-04-17 LAB — NOVEL CORONAVIRUS, NAA: SARS-CoV-2, NAA: NOT DETECTED

## 2019-09-08 ENCOUNTER — Ambulatory Visit: Payer: Self-pay | Attending: Internal Medicine

## 2019-09-08 ENCOUNTER — Ambulatory Visit: Payer: Self-pay

## 2019-09-08 DIAGNOSIS — Z23 Encounter for immunization: Secondary | ICD-10-CM

## 2019-09-08 NOTE — Progress Notes (Signed)
   Covid-19 Vaccination Clinic  Name:  Stanley Montes    MRN: [id number] DOB: 01-Aug-1955  09/08/2019  Stanley Montes was observed post Covid-19 immunization for 15 minutes without incident. He was provided with Vaccine Information Sheet and instruction to access the V-Safe system.   Stanley Montes was instructed to call 911 with any severe reactions post vaccine: Marland Kitchen Difficulty breathing  . Swelling of face and throat  . A fast heartbeat  . A bad rash all over body  . Dizziness and weakness   Immunizations Administered    Name Date Dose VIS Date Route   Moderna COVID-19 Vaccine 09/08/2019 12:18 PM 0.5 mL 05/17/2019 Intramuscular   Manufacturer: Moderna   Lot: [id number]   NDC: 74715-953-96

## 2019-10-06 ENCOUNTER — Ambulatory Visit: Payer: Self-pay | Attending: Internal Medicine

## 2019-10-06 DIAGNOSIS — Z23 Encounter for immunization: Secondary | ICD-10-CM

## 2019-10-06 NOTE — Progress Notes (Signed)
   Covid-19 Vaccination Clinic  Name:  Stanley Montes    MRN: [id number] DOB: [birth year]  10/06/2019  [name] was observed post Covid-19 immunization for 15 minutes without incident. He was provided with Vaccine Information Sheet and instruction to access the V-Safe system.   [name] was instructed to call 911 with any severe reactions post vaccine: Marland Kitchen Difficulty breathing  . Swelling of face and throat  . A fast heartbeat  . A bad rash all over body  . Dizziness and weakness   Immunizations Administered    Name Date Dose VIS Date Route   Moderna COVID-19 Vaccine 10/06/2019 12:21 PM 0.5 mL 05/2019 Intramuscular   Manufacturer: Moderna   Lot: [id number]   NDC: 56387-564-33

## 2020-07-16 DIAGNOSIS — D123 Benign neoplasm of transverse colon: Secondary | ICD-10-CM | POA: Insufficient documentation

## 2022-01-22 DIAGNOSIS — Z125 Encounter for screening for malignant neoplasm of prostate: Secondary | ICD-10-CM | POA: Diagnosis not present

## 2022-01-22 DIAGNOSIS — I10 Essential (primary) hypertension: Secondary | ICD-10-CM | POA: Diagnosis not present

## 2022-01-22 DIAGNOSIS — I700 Atherosclerosis of aorta: Secondary | ICD-10-CM | POA: Diagnosis not present

## 2022-01-22 DIAGNOSIS — Z79899 Other long term (current) drug therapy: Secondary | ICD-10-CM | POA: Diagnosis not present

## 2022-01-30 DIAGNOSIS — E785 Hyperlipidemia, unspecified: Secondary | ICD-10-CM | POA: Diagnosis not present

## 2022-01-30 DIAGNOSIS — I10 Essential (primary) hypertension: Secondary | ICD-10-CM | POA: Diagnosis not present

## 2022-08-04 DIAGNOSIS — I10 Essential (primary) hypertension: Secondary | ICD-10-CM | POA: Diagnosis not present

## 2022-08-04 DIAGNOSIS — Z716 Tobacco abuse counseling: Secondary | ICD-10-CM | POA: Diagnosis not present

## 2022-11-05 ENCOUNTER — Ambulatory Visit (HOSPITAL_COMMUNITY)
Admission: RE | Admit: 2022-11-05 | Discharge: 2022-11-05 | Disposition: A | Discharge status: Home / Self Care | Payer: 59 | Source: Ambulatory Visit | Attending: Internal Medicine | Admitting: Internal Medicine

## 2022-11-05 ENCOUNTER — Other Ambulatory Visit (HOSPITAL_COMMUNITY): Payer: Self-pay | Admitting: Internal Medicine

## 2022-11-05 DIAGNOSIS — R0600 Dyspnea, unspecified: Secondary | ICD-10-CM

## 2022-11-05 DIAGNOSIS — F17210 Nicotine dependence, cigarettes, uncomplicated: Secondary | ICD-10-CM | POA: Diagnosis not present

## 2022-11-05 DIAGNOSIS — I10 Essential (primary) hypertension: Secondary | ICD-10-CM | POA: Diagnosis not present

## 2022-11-05 DIAGNOSIS — R0602 Shortness of breath: Secondary | ICD-10-CM | POA: Diagnosis not present

## 2023-02-04 DIAGNOSIS — I10 Essential (primary) hypertension: Secondary | ICD-10-CM | POA: Diagnosis not present

## 2023-02-04 DIAGNOSIS — Z79899 Other long term (current) drug therapy: Secondary | ICD-10-CM | POA: Diagnosis not present

## 2023-02-04 DIAGNOSIS — G4730 Sleep apnea, unspecified: Secondary | ICD-10-CM | POA: Diagnosis not present

## 2023-06-18 DIAGNOSIS — I10 Essential (primary) hypertension: Secondary | ICD-10-CM | POA: Diagnosis not present

## 2023-06-18 DIAGNOSIS — G000 Hemophilus meningitis: Secondary | ICD-10-CM | POA: Diagnosis not present

## 2023-06-18 DIAGNOSIS — Z23 Encounter for immunization: Secondary | ICD-10-CM | POA: Diagnosis not present

## 2023-06-18 DIAGNOSIS — E785 Hyperlipidemia, unspecified: Secondary | ICD-10-CM | POA: Diagnosis not present

## 2023-06-18 DIAGNOSIS — R7301 Impaired fasting glucose: Secondary | ICD-10-CM | POA: Diagnosis not present

## 2023-07-03 DIAGNOSIS — S9001XA Contusion of right ankle, initial encounter: Secondary | ICD-10-CM | POA: Diagnosis not present

## 2023-07-03 DIAGNOSIS — R609 Edema, unspecified: Secondary | ICD-10-CM | POA: Diagnosis not present

## 2023-07-03 DIAGNOSIS — M25571 Pain in right ankle and joints of right foot: Secondary | ICD-10-CM | POA: Diagnosis not present

## 2023-08-06 DIAGNOSIS — H524 Presbyopia: Secondary | ICD-10-CM | POA: Diagnosis not present

## 2023-08-06 DIAGNOSIS — H5213 Myopia, bilateral: Secondary | ICD-10-CM | POA: Diagnosis not present

## 2023-08-14 DIAGNOSIS — H524 Presbyopia: Secondary | ICD-10-CM | POA: Diagnosis not present

## 2023-10-06 DIAGNOSIS — I10 Essential (primary) hypertension: Secondary | ICD-10-CM | POA: Diagnosis not present

## 2023-10-06 DIAGNOSIS — M85621 Other cyst of bone, right upper arm: Secondary | ICD-10-CM | POA: Diagnosis not present

## 2023-10-22 ENCOUNTER — Other Ambulatory Visit: Payer: Self-pay | Admitting: *Deleted

## 2023-10-22 DIAGNOSIS — R2231 Localized swelling, mass and lump, right upper limb: Secondary | ICD-10-CM

## 2023-10-27 ENCOUNTER — Ambulatory Visit: Admitting: General Surgery

## 2023-10-27 DIAGNOSIS — R2231 Localized swelling, mass and lump, right upper limb: Secondary | ICD-10-CM

## 2023-10-29 DIAGNOSIS — Z1211 Encounter for screening for malignant neoplasm of colon: Secondary | ICD-10-CM | POA: Diagnosis not present

## 2023-10-29 DIAGNOSIS — D127 Benign neoplasm of rectosigmoid junction: Secondary | ICD-10-CM | POA: Diagnosis not present

## 2023-10-29 DIAGNOSIS — K635 Polyp of colon: Secondary | ICD-10-CM | POA: Diagnosis not present

## 2023-10-29 DIAGNOSIS — Z860101 Personal history of adenomatous and serrated colon polyps: Secondary | ICD-10-CM | POA: Diagnosis not present

## 2023-11-19 ENCOUNTER — Ambulatory Visit (INDEPENDENT_AMBULATORY_CARE_PROVIDER_SITE_OTHER): Admitting: General Surgery

## 2023-11-19 ENCOUNTER — Encounter: Payer: Self-pay | Admitting: General Surgery

## 2023-11-19 VITALS — BP 149/88 | HR 73 | Temp 96.0°F | Resp 14 | Ht 72.0 in | Wt 227.0 lb

## 2023-11-19 DIAGNOSIS — R2231 Localized swelling, mass and lump, right upper limb: Secondary | ICD-10-CM | POA: Insufficient documentation

## 2023-11-19 NOTE — Progress Notes (Signed)
 [facility] History and Physical  Reason for Referral: Right forearm cyst Referring Physician: Dr. Hildy Lowers   Chief Complaint   New Patient (Initial Visit)     Stanley Montes is a 68 y.o. male.  HPI:   Discussed the use of AI scribe software for clinical note transcription with the patient, who gave verbal consent to proceed.  History of Present Illness [name] "Stanley Montes" is a 68 year old male who presents with a cyst on his arm. He was referred by Dr. Matthew Songster or Dr. Hildy Lowers for evaluation of a sebaceous cyst on his arm.  He has had a cyst on his arm for approximately ten years. Initially, it was the size of the end of his finger, but over the past two to three years, it has increased in size to approximately two centimeters. The cyst is located on the radial anterior/lateral part of his arm.  He experiences an 'electric shockwave' sensation from the cyst, especially when he accidentally hits it while reaching into a cabinet. No inflammation, infection, or drainage from the cyst has been noted, and no imaging studies have been performed to date.  No issues with finger movement.       Past Medical History:  Diagnosis Date   High cholesterol    Hypertension    OSA on CPAP     Past Surgical History:  Procedure Laterality Date   BACK SURGERY     LAPAROSCOPIC CHOLECYSTECTOMY  ~ 2003   POSTERIOR LUMBAR FUSION  2002   TONSILLECTOMY  ~ 1967    Family History  Problem Relation Age of Onset   CAD Father    Alzheimer's disease Mother     Social History   Tobacco Use   Smoking status: Every Day    Current packs/day: 0.50    Average packs/day: 0.5 packs/day for 48.6 years (24.3 ttl pk-yrs)    Types: Cigarettes    Start date: 04/26/1975   Smokeless tobacco: Never  Substance Use Topics   Alcohol use: Yes    Alcohol/week: 10.0 standard drinks of alcohol    Types: 10 Shots of liquor per week   Drug use: No    Medications: I have reviewed the patient's  current medications. Allergies as of 11/19/2023   No Known Allergies      Medication List        Accurate as of November 19, 2023  4:03 PM. If you have any questions, ask your nurse or doctor.          amLODipine 5 MG tablet Commonly known as: NORVASC Take 5 mg by mouth daily.   atorvastatin 20 MG tablet Commonly known as: LIPITOR Take 20 mg by mouth.   clonazePAM 0.5 MG tablet Commonly known as: KLONOPIN Take 0.5 mg by mouth 2 (two) times daily as needed for anxiety.   lisinopril  40 MG tablet Commonly known as: ZESTRIL  Take 40 mg by mouth daily.         ROS:  A comprehensive review of systems was negative except for: Musculoskeletal: positive for cyst on the right  forearm with electrical pains in to the hand on the dorsal surface   Blood pressure (!) 149/88, pulse 73, temperature (!) 96 F (35.6 C), temperature source Oral, resp. rate 14, height 6' (1.829 m), weight 227 lb (103 kg), SpO2 96%.  Physical Exam GENERAL: Alert, cooperative, well developed, no acute distress. HEENT: Normocephalic, normal oropharynx, moist mucous membranes. CHEST: Clear to auscultation bilaterally. No wheezes, rhonchi, or  crackles. CARDIOVASCULAR: Normal heart rate and rhythm ABDOMEN: Soft, non-tender, non-distended, without organomegaly. Normal bowel sounds. EXTREMITIES: No cyanosis or edema. MUSCULOSKELETAL: Firm, mildly tender cyst on radial anterior/lateral aspect of the forearm, approximately 2 cm. Does not move and feels like it is connected to the tendon NEUROLOGICAL: Cranial nerves grossly intact. Moves all extremities without gross motor or sensory deficit.  Results: None   Assessment & Plan Mass on the right forearm that I think is more likely a ganglion cyst and not a sebaceous cyst. It feels fixed, attached to the tendon and is causing this electrical pain in to the hand.  - Ordered ultrasound of arm to assess cyst characteristics. - Consider orthopedic referral based on  ultrasound findings. - Will call with results and determine next steps    All questions answered.   Future Appointments  Date Time Provider Department Center  11/23/2023  3:30 PM [facility]  5 [facility]  [place] Norleen Beaver 11/19/2023, 4:03 PM

## 2023-11-19 NOTE — Patient Instructions (Signed)
 Will get the Ultrasound to look at the area and determine if this is more of a ganglion cyst and will figure out the next steps. Will call you with the results. It can take 10 days or longer to get the reports back from the radiologist.

## 2023-11-23 ENCOUNTER — Ambulatory Visit (HOSPITAL_COMMUNITY)
Admission: RE | Admit: 2023-11-23 | Discharge: 2023-11-23 | Disposition: A | Discharge status: Home / Self Care | Source: Ambulatory Visit | Attending: General Surgery | Admitting: General Surgery

## 2023-11-23 DIAGNOSIS — R2231 Localized swelling, mass and lump, right upper limb: Secondary | ICD-10-CM | POA: Insufficient documentation

## 2023-11-29 ENCOUNTER — Ambulatory Visit (INDEPENDENT_AMBULATORY_CARE_PROVIDER_SITE_OTHER): Payer: Self-pay | Admitting: General Surgery

## 2023-11-29 DIAGNOSIS — R2231 Localized swelling, mass and lump, right upper limb: Secondary | ICD-10-CM

## 2023-11-29 NOTE — Progress Notes (Signed)
 Sounds like we need to get an MRI. Can we get that ordered for him. Let him know the US  is non diagnostic.

## 2023-11-30 NOTE — Telephone Encounter (Signed)
 Call placed to patient to schedule MRI.   Patient states that he has decided to delay imaging at this time. Reports that he is not having any pain from area, but he will call back if area starts to bother him.

## 2023-11-30 NOTE — Addendum Note (Signed)
 Addended by: Cathalene Clipper on: 11/30/2023 11:24 AM   Modules accepted: Orders

## 2023-12-01 NOTE — Telephone Encounter (Signed)
 Patient returned call and requested to schedule MRI.   Appointment scheduled.

## 2023-12-01 NOTE — Telephone Encounter (Signed)
 Discussed with Dr. Collene Dawson that patient would like to cancel MRI at this time. Provider recommended proceeding with MRI as IS was non- diagnostic. Advised that hypoechoic masses can be caused by a variety of factors, including benign growths like cysts, fibroadenomas, and other benign tumors, inflammation/infection like abscesses or other inflammatory conditions or malignant tumors like sarcomas.   Call placed to patient and patient made aware. States that he will call office on 12/07/2023 to discuss further as he is currently on vacation.

## 2023-12-07 ENCOUNTER — Other Ambulatory Visit (HOSPITAL_COMMUNITY)

## 2023-12-15 ENCOUNTER — Other Ambulatory Visit (HOSPITAL_COMMUNITY)

## 2023-12-15 ENCOUNTER — Ambulatory Visit (HOSPITAL_COMMUNITY)
Admission: RE | Admit: 2023-12-15 | Discharge: 2023-12-15 | Disposition: A | Discharge status: Home / Self Care | Source: Ambulatory Visit | Attending: General Surgery | Admitting: General Surgery

## 2023-12-15 DIAGNOSIS — M799 Soft tissue disorder, unspecified: Secondary | ICD-10-CM | POA: Diagnosis not present

## 2023-12-15 DIAGNOSIS — R2231 Localized swelling, mass and lump, right upper limb: Secondary | ICD-10-CM | POA: Diagnosis not present

## 2023-12-15 MED ORDER — GADOBUTROL 1 MMOL/ML IV SOLN
10.0000 mL | Freq: Once | INTRAVENOUS | Status: AC | PRN
  Administered 2023-12-15: 10 mL via INTRAVENOUS

## 2023-12-17 ENCOUNTER — Ambulatory Visit (INDEPENDENT_AMBULATORY_CARE_PROVIDER_SITE_OTHER): Admitting: General Surgery

## 2023-12-17 DIAGNOSIS — R2231 Localized swelling, mass and lump, right upper limb: Secondary | ICD-10-CM

## 2023-12-17 NOTE — Progress Notes (Signed)
 Can we refer him to hand surgery. Given the MRI read, I would recommend he be evaluated by them to see if it needs removal.

## 2023-12-17 NOTE — Addendum Note (Signed)
 Addended by: SAUNDRA TAWNI DEL on: 12/17/2023 10:09 AM   Modules accepted: Orders

## 2024-01-13 ENCOUNTER — Ambulatory Visit: Admitting: Orthopedic Surgery

## 2024-01-19 ENCOUNTER — Encounter: Payer: Self-pay | Admitting: Orthopedic Surgery

## 2024-01-19 ENCOUNTER — Ambulatory Visit: Admitting: Orthopedic Surgery

## 2024-01-19 VITALS — BP 161/90 | HR 71 | Ht 72.0 in | Wt 220.0 lb

## 2024-01-19 DIAGNOSIS — R2231 Localized swelling, mass and lump, right upper limb: Secondary | ICD-10-CM | POA: Diagnosis not present

## 2024-01-19 DIAGNOSIS — D492 Neoplasm of unspecified behavior of bone, soft tissue, and skin: Secondary | ICD-10-CM | POA: Diagnosis not present

## 2024-01-19 NOTE — Addendum Note (Signed)
 Addended byBETHA JENEAN GREIG LELON on: 01/19/2024 09:47 AM   Modules accepted: Orders

## 2024-01-19 NOTE — Patient Instructions (Signed)
 I will send your referral to [facility]. You can call them to schedule the number is [phone]. They will need you to get a CD of the xray images from [facility]. The number to call for the CD is [phone] ask for [facility]. The doctors are Dr Wash and Dr Tanda

## 2024-01-19 NOTE — Progress Notes (Signed)
 New Patient Visit  Assessment: Stanley Montes is a 68 y.o. male with the following: 1. Mass of right forearm  Plan: Stanley Montes has a mass on his right forearm, that has been present for 8 or more years.  His health has remained stable.  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  Given its proximity, I have recommended referral to [facility] for further evaluation.  We had an extensive discussion in clinic today in regards to the growth.  Likelihood of it being an aggressive tumor is low, given the chronicity of the growth.  Nonetheless, I do think it is reasonable for him to be evaluated, with consideration for biopsy versus excision.  He states understanding.  Will place the referral.  He will contact us  if he has issues scheduling an appointment.  Follow-up: Return for Referral to [facility].  Subjective:  Chief Complaint  Patient presents with   Arm Problem    Right forearm mass / he does have tingling at times and states it has gotten smaller recently     History of Present Illness: Stanley Montes is a 68 y.o. male who has been referred by Stanley Pander, MD for evaluation of right forearm mass.  He states he has had a small growth in his forearm for at least 8 years, maybe longer.  No specific onset.  It started out the size of a dime, but has continued to grow.  He feels as though it is gotten a little bit smaller recently.  He was evaluated by Dr. Pander, and obtained an ultrasound, which was inconclusive.  He has also had an MRI, and is here to review the findings.  His health has remained stable.  He has no issues with overlying skin.  It is not painful.  He does note some shooting pains to the dorsum of his hand when he bumps the growth.   Review of Systems: No fevers or chills No numbness or tingling No chest pain No shortness of breath No bowel or bladder dysfunction No GI distress No headaches   Medical  History:  Past Medical History:  Diagnosis Date   High cholesterol    Hypertension    OSA on CPAP     Past Surgical History:  Procedure Laterality Date   BACK SURGERY     LAPAROSCOPIC CHOLECYSTECTOMY  ~ 2003   POSTERIOR LUMBAR FUSION  2002   TONSILLECTOMY  ~ 1967    Family History  Problem Relation Age of Onset   CAD Father    Alzheimer's disease Mother    Social History   Tobacco Use   Smoking status: Every Day    Current packs/day: 0.50    Average packs/day: 0.5 packs/day for 48.7 years (24.4 ttl pk-yrs)    Types: Cigarettes    Start date: 04/26/1975   Smokeless tobacco: Never  Substance Use Topics   Alcohol use: Yes    Alcohol/week: 10.0 standard drinks of alcohol    Types: 10 Shots of liquor per week   Drug use: No    No Known Allergies  Current Meds  Medication Sig   amLODipine (NORVASC) 5 MG tablet Take 5 mg by mouth daily.   atorvastatin (LIPITOR) 20 MG tablet Take 20 mg by mouth.   clonazePAM (KLONOPIN) 0.5 MG tablet Take 0.5 mg by mouth 2 (two) times daily as needed for anxiety.   lisinopril  (PRINIVIL ,ZESTRIL ) 40 MG tablet Take 40 mg by mouth  daily.   tadalafil (CIALIS) 20 MG tablet SMARTSIG:0.5-1 Tablet(s) By Mouth PRN    Objective: BP (!) 161/90   Pulse 71   Ht 6' (1.829 m)   Wt 220 lb (99.8 kg)   BMI 29.84 kg/m   Physical Exam:  General: Alert and oriented. and No acute distress. Gait: Normal gait.  Right forearm with obvious mass on the dorsal aspect.  Mid forearm area.  No overlying skin changes.  No redness.  Mass is firm, but mobile.  It is not matted to the skin.  Positive Tinel's in the area of the mass.  Sensation is intact to the right hand.  He has good grip strength.  Fingers are warm and well-perfused.  IMAGING: I personally reviewed images previously obtained in clinic   Right forearm MRI  IMPRESSION: Subcutaneous soft tissue lesion at the area of interest measuring up to 17 mm. 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 A  more worrisome lesion is not excluded. Tissue sampling suggested.  New Medications:  No orders of the defined types were placed in this encounter.     Stanley DELENA Horde, MD  01/19/2024 8:48 AM

## 2024-02-02 DIAGNOSIS — D3612 Benign neoplasm of peripheral nerves and autonomic nervous system, upper limb, including shoulder: Secondary | ICD-10-CM | POA: Diagnosis not present

## 2024-03-23 DIAGNOSIS — Z23 Encounter for immunization: Secondary | ICD-10-CM | POA: Diagnosis not present

## 2024-03-23 DIAGNOSIS — I10 Essential (primary) hypertension: Secondary | ICD-10-CM | POA: Diagnosis not present

## 2024-04-27 DIAGNOSIS — I10 Essential (primary) hypertension: Secondary | ICD-10-CM | POA: Diagnosis not present

## 2024-04-27 DIAGNOSIS — Z125 Encounter for screening for malignant neoplasm of prostate: Secondary | ICD-10-CM | POA: Diagnosis not present

## 2024-04-27 DIAGNOSIS — G4730 Sleep apnea, unspecified: Secondary | ICD-10-CM | POA: Diagnosis not present

## 2024-04-27 DIAGNOSIS — Z79899 Other long term (current) drug therapy: Secondary | ICD-10-CM | POA: Diagnosis not present

## 2024-05-04 DIAGNOSIS — Z79899 Other long term (current) drug therapy: Secondary | ICD-10-CM | POA: Diagnosis not present

## 2024-05-04 DIAGNOSIS — N23 Unspecified renal colic: Secondary | ICD-10-CM | POA: Diagnosis not present

## 2024-05-04 DIAGNOSIS — I10 Essential (primary) hypertension: Secondary | ICD-10-CM | POA: Diagnosis not present

## 2024-05-04 DIAGNOSIS — D696 Thrombocytopenia, unspecified: Secondary | ICD-10-CM | POA: Diagnosis not present

## 2024-05-04 DIAGNOSIS — G4730 Sleep apnea, unspecified: Secondary | ICD-10-CM | POA: Diagnosis not present

## 2024-05-04 DIAGNOSIS — E785 Hyperlipidemia, unspecified: Secondary | ICD-10-CM | POA: Diagnosis not present

## 2024-05-06 ENCOUNTER — Other Ambulatory Visit (HOSPITAL_COMMUNITY): Payer: Self-pay | Admitting: Internal Medicine

## 2024-05-06 DIAGNOSIS — R748 Abnormal levels of other serum enzymes: Secondary | ICD-10-CM

## 2024-05-06 DIAGNOSIS — N289 Disorder of kidney and ureter, unspecified: Secondary | ICD-10-CM

## 2024-05-16 ENCOUNTER — Ambulatory Visit (HOSPITAL_COMMUNITY)
Admission: RE | Admit: 2024-05-16 | Discharge: 2024-05-16 | Disposition: A | Source: Ambulatory Visit | Attending: Internal Medicine | Admitting: Internal Medicine

## 2024-05-16 DIAGNOSIS — N289 Disorder of kidney and ureter, unspecified: Secondary | ICD-10-CM | POA: Diagnosis not present

## 2024-05-16 DIAGNOSIS — N2889 Other specified disorders of kidney and ureter: Secondary | ICD-10-CM | POA: Diagnosis not present

## 2024-05-16 DIAGNOSIS — R748 Abnormal levels of other serum enzymes: Secondary | ICD-10-CM | POA: Insufficient documentation
